# Patient Record
Sex: Female | Born: 1960 | Race: White | Hispanic: No | Marital: Single | Smoking: Current every day smoker
Health system: Southern US, Community
[De-identification: ages and names within clinical notes are randomized; demographics above are authoritative.]

## PROBLEM LIST (undated history)

## (undated) DIAGNOSIS — T07XXXA Unspecified multiple injuries, initial encounter: Secondary | ICD-10-CM

## (undated) DIAGNOSIS — J45909 Unspecified asthma, uncomplicated: Secondary | ICD-10-CM

## (undated) DIAGNOSIS — J40 Bronchitis, not specified as acute or chronic: Secondary | ICD-10-CM

## (undated) DIAGNOSIS — Z720 Tobacco use: Secondary | ICD-10-CM

## (undated) DIAGNOSIS — M2540 Effusion, unspecified joint: Secondary | ICD-10-CM

## (undated) DIAGNOSIS — E785 Hyperlipidemia, unspecified: Secondary | ICD-10-CM

## (undated) DIAGNOSIS — M549 Dorsalgia, unspecified: Secondary | ICD-10-CM

## (undated) DIAGNOSIS — M069 Rheumatoid arthritis, unspecified: Secondary | ICD-10-CM

## (undated) DIAGNOSIS — M542 Cervicalgia: Secondary | ICD-10-CM

## (undated) DIAGNOSIS — F329 Major depressive disorder, single episode, unspecified: Secondary | ICD-10-CM

## (undated) DIAGNOSIS — F32A Depression, unspecified: Secondary | ICD-10-CM

## (undated) DIAGNOSIS — M2550 Pain in unspecified joint: Secondary | ICD-10-CM

## (undated) DIAGNOSIS — K5909 Other constipation: Secondary | ICD-10-CM

## (undated) DIAGNOSIS — Z972 Presence of dental prosthetic device (complete) (partial): Secondary | ICD-10-CM

## (undated) DIAGNOSIS — B958 Unspecified staphylococcus as the cause of diseases classified elsewhere: Secondary | ICD-10-CM

## (undated) DIAGNOSIS — J189 Pneumonia, unspecified organism: Secondary | ICD-10-CM

## (undated) DIAGNOSIS — R238 Other skin changes: Secondary | ICD-10-CM

## (undated) DIAGNOSIS — R233 Spontaneous ecchymoses: Secondary | ICD-10-CM

## (undated) DIAGNOSIS — G8929 Other chronic pain: Secondary | ICD-10-CM

## (undated) DIAGNOSIS — I10 Essential (primary) hypertension: Secondary | ICD-10-CM

## (undated) DIAGNOSIS — F419 Anxiety disorder, unspecified: Secondary | ICD-10-CM

## (undated) DIAGNOSIS — Z973 Presence of spectacles and contact lenses: Secondary | ICD-10-CM

## (undated) HISTORY — PX: ESOPHAGOGASTRECTOMY: SHX1528

## (undated) HISTORY — PX: FOOT SURGERY: SHX648

## (undated) HISTORY — DX: Major depressive disorder, single episode, unspecified: F32.9

## (undated) HISTORY — PX: TONSILLECTOMY: SUR1361

## (undated) HISTORY — DX: Depression, unspecified: F32.A

## (undated) HISTORY — PX: DIAGNOSTIC LAPAROSCOPY: SUR761

## (undated) HISTORY — PX: APPENDECTOMY: SHX54

## (undated) HISTORY — PX: COLONOSCOPY: SHX174

---
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
## 1982-12-17 HISTORY — PX: ABDOMINAL HYSTERECTOMY: SHX81

## 2001-01-22 ENCOUNTER — Other Ambulatory Visit: Admission: RE | Admit: 2001-01-22 | Discharge: 2001-01-22 | Payer: Self-pay | Admitting: Obstetrics & Gynecology

## 2003-04-02 ENCOUNTER — Other Ambulatory Visit: Admission: RE | Admit: 2003-04-02 | Discharge: 2003-04-02 | Payer: Self-pay | Admitting: Obstetrics & Gynecology

## 2004-04-07 ENCOUNTER — Other Ambulatory Visit: Admission: RE | Admit: 2004-04-07 | Discharge: 2004-04-07 | Payer: Self-pay | Admitting: Obstetrics & Gynecology

## 2005-05-24 ENCOUNTER — Other Ambulatory Visit: Admission: RE | Admit: 2005-05-24 | Discharge: 2005-05-24 | Payer: Self-pay | Admitting: Obstetrics & Gynecology

## 2005-10-17 ENCOUNTER — Ambulatory Visit (HOSPITAL_BASED_OUTPATIENT_CLINIC_OR_DEPARTMENT_OTHER): Admission: RE | Admit: 2005-10-17 | Discharge: 2005-10-17 | Payer: Self-pay | Admitting: *Deleted

## 2005-10-17 ENCOUNTER — Ambulatory Visit (HOSPITAL_COMMUNITY): Admission: RE | Admit: 2005-10-17 | Discharge: 2005-10-17 | Payer: Self-pay | Admitting: *Deleted

## 2008-12-17 HISTORY — PX: FOOT SURGERY: SHX648

## 2012-03-17 HISTORY — PX: BACK SURGERY: SHX140

## 2012-03-20 ENCOUNTER — Ambulatory Visit
Admission: RE | Admit: 2012-03-20 | Discharge: 2012-03-20 | Disposition: A | Discharge status: Home / Self Care | Payer: 59 | Source: Ambulatory Visit | Attending: Anesthesiology | Admitting: Anesthesiology

## 2012-03-20 ENCOUNTER — Other Ambulatory Visit: Payer: Self-pay | Admitting: Anesthesiology

## 2012-03-20 DIAGNOSIS — M542 Cervicalgia: Secondary | ICD-10-CM

## 2012-03-20 DIAGNOSIS — M5416 Radiculopathy, lumbar region: Secondary | ICD-10-CM

## 2012-03-24 ENCOUNTER — Encounter (HOSPITAL_COMMUNITY): Payer: Self-pay | Admitting: Pharmacy Technician

## 2012-03-24 ENCOUNTER — Other Ambulatory Visit: Payer: Self-pay | Admitting: Neurosurgery

## 2012-04-02 ENCOUNTER — Encounter (HOSPITAL_COMMUNITY)
Admission: RE | Admit: 2012-04-02 | Discharge: 2012-04-02 | Disposition: A | Discharge status: Home / Self Care | Payer: 59 | Source: Ambulatory Visit | Attending: Anesthesiology | Admitting: Anesthesiology

## 2012-04-02 ENCOUNTER — Encounter (HOSPITAL_COMMUNITY): Payer: Self-pay

## 2012-04-02 ENCOUNTER — Encounter (HOSPITAL_COMMUNITY)
Admission: RE | Admit: 2012-04-02 | Discharge: 2012-04-02 | Disposition: A | Discharge status: Home / Self Care | Payer: 59 | Source: Ambulatory Visit | Attending: Neurosurgery | Admitting: Neurosurgery

## 2012-04-02 HISTORY — DX: Dorsalgia, unspecified: M54.9

## 2012-04-02 HISTORY — DX: Pneumonia, unspecified organism: J18.9

## 2012-04-02 HISTORY — DX: Essential (primary) hypertension: I10

## 2012-04-02 HISTORY — DX: Hyperlipidemia, unspecified: E78.5

## 2012-04-02 HISTORY — DX: Effusion, unspecified joint: M25.40

## 2012-04-02 HISTORY — DX: Bronchitis, not specified as acute or chronic: J40

## 2012-04-02 HISTORY — DX: Pain in unspecified joint: M25.50

## 2012-04-02 HISTORY — DX: Spontaneous ecchymoses: R23.3

## 2012-04-02 HISTORY — DX: Rheumatoid arthritis, unspecified: M06.9

## 2012-04-02 HISTORY — DX: Other constipation: K59.09

## 2012-04-02 HISTORY — DX: Anxiety disorder, unspecified: F41.9

## 2012-04-02 HISTORY — DX: Other skin changes: R23.8

## 2012-04-02 HISTORY — DX: Unspecified staphylococcus as the cause of diseases classified elsewhere: B95.8

## 2012-04-02 HISTORY — DX: Other chronic pain: G89.29

## 2012-04-02 HISTORY — DX: Cervicalgia: M54.2

## 2012-04-02 HISTORY — DX: Unspecified multiple injuries, initial encounter: T07.XXXA

## 2012-04-02 LAB — BASIC METABOLIC PANEL
BUN: 6 mg/dL (ref 6–23)
GFR calc non Af Amer: >90 mL/min (ref >90)
Glucose, Bld: 90 mg/dL (ref 70–99)
Potassium: 4.3 mEq/L (ref 3.5–5.1)

## 2012-04-02 LAB — ABO/RH: ABO/RH(D): A NEG

## 2012-04-02 LAB — URINALYSIS, ROUTINE W REFLEX MICROSCOPIC
Bilirubin Urine: NEGATIVE
Hgb urine dipstick: NEGATIVE
Ketones, ur: NEGATIVE mg/dL
Protein, ur: NEGATIVE mg/dL
Urobilinogen, UA: 0.2 mg/dL (ref 0.0–1.0)

## 2012-04-02 LAB — CBC
HCT: 44 % (ref 36.0–46.0)
Hemoglobin: 14 g/dL (ref 12.0–15.0)
MCH: 31.6 pg (ref 26.0–34.0)
MCHC: 31.8 g/dL (ref 30.0–36.0)

## 2012-04-02 LAB — DIFFERENTIAL
Basophils Relative: 0 % (ref 0–1)
Eosinophils Absolute: 0 10*3/uL (ref 0.0–0.7)
Eosinophils Relative: 0 % (ref 0–5)
Monocytes Absolute: 0.7 10*3/uL (ref 0.1–1.0)
Monocytes Relative: 4 % (ref 3–12)
Neutrophils Relative %: 76 % (ref 43–77)

## 2012-04-02 LAB — TYPE AND SCREEN

## 2012-04-02 MED ORDER — CEFAZOLIN SODIUM 1-5 GM-% IV SOLN: 1.0000 g | INTRAVENOUS | Status: DC

## 2012-04-02 NOTE — Pre-Procedure Instructions (Signed)
20 [name]  04/02/2012   Your procedure is scheduled on:  Tues, April 23 @ 0730  Report to [facility] at 0530 AM.  Call this number if you have problems the morning of surgery: [phone]   Remember:   Do not eat food:After Midnight.  May have clear liquids: up to 4 Hours before arrival.(until 1:30 am )  Clear liquids include soda, tea, black coffee, apple or grape juice, broth,water  Take these medicines the morning of surgery with A SIP OF WATER: Celexa,Gabapentin,and Pain Pill(if needed)   Do not wear jewelry, make-up or nail polish.  Do not wear lotions, powders, or perfumes.   Do not shave 48 hours prior to surgery.  Do not bring valuables to the hospital.  Contacts, dentures or bridgework may not be worn into surgery.  Leave suitcase in the car. After surgery it may be brought to your room.  For patients admitted to the hospital, checkout time is 11:00 AM the day of discharge.   Special Instructions: CHG Shower Use Special Wash: 1/2 bottle night before surgery and 1/2 bottle morning of surgery.   Please read over the following fact sheets that you were given: Pain Booklet, Coughing and Deep Breathing, Blood Transfusion Information, MRSA Information and Surgical Site Infection Prevention

## 2012-04-02 NOTE — Progress Notes (Signed)
Lindwood Coke with Dr.Hirsch to notify of pt allergy to PCN and to see if he wants to continue with Ancef or change to something else

## 2012-04-02 NOTE — Progress Notes (Signed)
Pt doesn't have a cardiogist  Echo/Stress test done in 2005-pt states this was done because blood pressure was high and then was placed on B/P meds  Denies ever having a heart cath  Medical Md is Dr.Heather Sprey at [facility] on [address] [address]

## 2012-04-03 NOTE — Consult Note (Addendum)
Anesthesia Chart Review:  Patient is a 51 year old female scheduled for a L5-S1 laminectomy, PLIF on 04/08/12.  History includes smoking, HTN, HLD, RA, PNA '92, bronchitis '06, anxiety, HLD,   PCP is Dr. Starling Manns at [facility].    Labs noted.  Her WBC is 18.7.  Her neutrophil # on differential was elevated at 14.2.  PLT 467.  UA was negative for leukocytes and nitrites.  She was afebrile at her PAT appointment.  Medications include prednisone, which may be contributing.  CXR on 04/02/12 showed no active disease. Mild thoracic dextroscoliosis.   EKG from 04/02/12 showed NSR.  Have left a voicemail with Shanda Bumps at Dr. Charlynn Court office re: patient's leukocytosis.  If no s/s of acute infection on the day of surgery then okay to proceed from an Anesthesia standpoint.  Defer additional orders, if any, to Dr. Blanche East.  Shonna Chock, PA-C  Addendum: 04/04/12 0945  Dr. Blanche East would like a CBC repeated on the day of surgery.  CBC ordered.

## 2012-04-07 MED ORDER — VANCOMYCIN HCL IN DEXTROSE 1-5 GM/200ML-% IV SOLN
1000.0000 mg | Freq: Once | INTRAVENOUS | Status: AC
  Administered 2012-04-08: 1000 mg via INTRAVENOUS
  Filled 2012-04-07: qty 200

## 2012-04-08 ENCOUNTER — Inpatient Hospital Stay (HOSPITAL_COMMUNITY)
Admission: RE | Admit: 2012-04-08 | Discharge: 2012-04-10 | DRG: 460 | Disposition: A | Discharge status: Home / Self Care | Payer: 59 | Source: Ambulatory Visit | Attending: Neurosurgery | Admitting: Neurosurgery

## 2012-04-08 ENCOUNTER — Encounter (HOSPITAL_COMMUNITY): Payer: Self-pay | Admitting: *Deleted

## 2012-04-08 ENCOUNTER — Encounter (HOSPITAL_COMMUNITY): Payer: Self-pay | Admitting: Vascular Surgery

## 2012-04-08 ENCOUNTER — Ambulatory Visit (HOSPITAL_COMMUNITY): Payer: 59

## 2012-04-08 ENCOUNTER — Encounter (HOSPITAL_COMMUNITY)
Admission: RE | Disposition: A | Discharge status: Home / Self Care | Payer: Self-pay | Source: Ambulatory Visit | Attending: Neurosurgery

## 2012-04-08 ENCOUNTER — Ambulatory Visit (HOSPITAL_COMMUNITY): Payer: 59 | Admitting: Vascular Surgery

## 2012-04-08 DIAGNOSIS — Z79899 Other long term (current) drug therapy: Secondary | ICD-10-CM

## 2012-04-08 DIAGNOSIS — Z01818 Encounter for other preprocedural examination: Secondary | ICD-10-CM

## 2012-04-08 DIAGNOSIS — Z01810 Encounter for preprocedural cardiovascular examination: Secondary | ICD-10-CM

## 2012-04-08 DIAGNOSIS — IMO0002 Reserved for concepts with insufficient information to code with codable children: Secondary | ICD-10-CM

## 2012-04-08 DIAGNOSIS — M5106 Intervertebral disc disorders with myelopathy, lumbar region: Principal | ICD-10-CM | POA: Diagnosis present

## 2012-04-08 DIAGNOSIS — Z6826 Body mass index (BMI) 26.0-26.9, adult: Secondary | ICD-10-CM

## 2012-04-08 DIAGNOSIS — M4716 Other spondylosis with myelopathy, lumbar region: Secondary | ICD-10-CM | POA: Diagnosis present

## 2012-04-08 DIAGNOSIS — Z01812 Encounter for preprocedural laboratory examination: Secondary | ICD-10-CM

## 2012-04-08 LAB — CBC
Hemoglobin: 14 g/dL (ref 12.0–15.0)
Platelets: 410 10*3/uL — ABNORMAL HIGH (ref 150–400)
RBC: 4.4 MIL/uL (ref 3.87–5.11)

## 2012-04-08 SURGERY — POSTERIOR LUMBAR FUSION 1 LEVEL
Anesthesia: General | Site: Spine Lumbar | Wound class: Clean

## 2012-04-08 MED ORDER — METHOCARBAMOL 100 MG/ML IJ SOLN
500.0000 mg | Freq: Four times a day (QID) | INTRAVENOUS | Status: DC | PRN
  Filled 2012-04-08: qty 5

## 2012-04-08 MED ORDER — DIPHENHYDRAMINE HCL 50 MG/ML IJ SOLN: 12.5000 mg | Freq: Four times a day (QID) | INTRAMUSCULAR | Status: DC | PRN

## 2012-04-08 MED ORDER — SODIUM CHLORIDE 0.9 % IV SOLN
INTRAVENOUS | Status: AC
  Filled 2012-04-08: qty 500

## 2012-04-08 MED ORDER — METHOCARBAMOL 500 MG PO TABS
500.0000 mg | ORAL_TABLET | Freq: Four times a day (QID) | ORAL | Status: DC | PRN
  Administered 2012-04-08 – 2012-04-09 (×2): 500 mg via ORAL
  Filled 2012-04-08 (×2): qty 1

## 2012-04-08 MED ORDER — FENTANYL CITRATE 0.05 MG/ML IJ SOLN
INTRAMUSCULAR | Status: DC | PRN
  Administered 2012-04-08: 25 ug via INTRAVENOUS
  Administered 2012-04-08 (×2): 50 ug via INTRAVENOUS
  Administered 2012-04-08: 25 ug via INTRAVENOUS
  Administered 2012-04-08 (×4): 50 ug via INTRAVENOUS
  Administered 2012-04-08: 100 ug via INTRAVENOUS

## 2012-04-08 MED ORDER — SODIUM CHLORIDE 0.9 % IV SOLN
INTRAVENOUS | Status: DC | PRN
  Administered 2012-04-08: 07:00:00 via INTRAVENOUS

## 2012-04-08 MED ORDER — LACTATED RINGERS IV SOLN
INTRAVENOUS | Status: DC | PRN
  Administered 2012-04-08 (×2): via INTRAVENOUS

## 2012-04-08 MED ORDER — DIPHENHYDRAMINE HCL 12.5 MG/5ML PO ELIX: 12.5000 mg | ORAL_SOLUTION | Freq: Four times a day (QID) | ORAL | Status: DC | PRN

## 2012-04-08 MED ORDER — SODIUM CHLORIDE 0.9 % IJ SOLN: 3.0000 mL | INTRAMUSCULAR | Status: DC | PRN

## 2012-04-08 MED ORDER — LOSARTAN POTASSIUM 50 MG PO TABS
50.0000 mg | ORAL_TABLET | Freq: Every day | ORAL | Status: DC
  Administered 2012-04-08 – 2012-04-10 (×3): 50 mg via ORAL
  Filled 2012-04-08 (×3): qty 1

## 2012-04-08 MED ORDER — CITALOPRAM HYDROBROMIDE 10 MG PO TABS
10.0000 mg | ORAL_TABLET | Freq: Every day | ORAL | Status: DC
  Administered 2012-04-09 – 2012-04-10 (×2): 10 mg via ORAL
  Filled 2012-04-08 (×3): qty 1

## 2012-04-08 MED ORDER — PHENYLEPHRINE HCL 10 MG/ML IJ SOLN
INTRAMUSCULAR | Status: DC | PRN
  Administered 2012-04-08: 80 ug via INTRAVENOUS
  Administered 2012-04-08: 40 ug via INTRAVENOUS

## 2012-04-08 MED ORDER — ONDANSETRON HCL 4 MG/2ML IJ SOLN: 4.0000 mg | INTRAMUSCULAR | Status: DC | PRN

## 2012-04-08 MED ORDER — BACITRACIN 50000 UNITS IM SOLR
INTRAMUSCULAR | Status: AC
  Filled 2012-04-08: qty 1

## 2012-04-08 MED ORDER — BISACODYL 10 MG RE SUPP: 10.0000 mg | Freq: Every day | RECTAL | Status: DC | PRN

## 2012-04-08 MED ORDER — NEOSTIGMINE METHYLSULFATE 1 MG/ML IJ SOLN
INTRAMUSCULAR | Status: DC | PRN
  Administered 2012-04-08: 4 mg via INTRAVENOUS

## 2012-04-08 MED ORDER — VANCOMYCIN HCL IN DEXTROSE 1-5 GM/200ML-% IV SOLN
1000.0000 mg | Freq: Two times a day (BID) | INTRAVENOUS | Status: AC
  Administered 2012-04-08 – 2012-04-09 (×3): 1000 mg via INTRAVENOUS
  Filled 2012-04-08 (×3): qty 200

## 2012-04-08 MED ORDER — ACETAMINOPHEN 325 MG PO TABS: 650.0000 mg | ORAL_TABLET | ORAL | Status: DC | PRN

## 2012-04-08 MED ORDER — PROPOFOL 10 MG/ML IV EMUL
INTRAVENOUS | Status: DC | PRN
  Administered 2012-04-08: 200 mg via INTRAVENOUS

## 2012-04-08 MED ORDER — HYDROMORPHONE HCL PF 1 MG/ML IJ SOLN
INTRAMUSCULAR | Status: AC
  Filled 2012-04-08: qty 1

## 2012-04-08 MED ORDER — THROMBIN 20000 UNITS EX KIT
PACK | CUTANEOUS | Status: DC | PRN
  Administered 2012-04-08: 09:00:00 via TOPICAL

## 2012-04-08 MED ORDER — SODIUM CHLORIDE 0.9 % IR SOLN
Status: DC | PRN
  Administered 2012-04-08: 09:00:00

## 2012-04-08 MED ORDER — ZOLPIDEM TARTRATE 10 MG PO TABS: 10.0000 mg | ORAL_TABLET | Freq: Every evening | ORAL | Status: DC | PRN

## 2012-04-08 MED ORDER — ONDANSETRON HCL 4 MG/2ML IJ SOLN
INTRAMUSCULAR | Status: DC | PRN
  Administered 2012-04-08: 4 mg via INTRAVENOUS

## 2012-04-08 MED ORDER — LIDOCAINE-EPINEPHRINE 1 %-1:100000 IJ SOLN
INTRAMUSCULAR | Status: DC | PRN
  Administered 2012-04-08: 20 mL

## 2012-04-08 MED ORDER — ACETAMINOPHEN 650 MG RE SUPP: 650.0000 mg | RECTAL | Status: DC | PRN

## 2012-04-08 MED ORDER — GLYCOPYRROLATE 0.2 MG/ML IJ SOLN
INTRAMUSCULAR | Status: DC | PRN
  Administered 2012-04-08: .8 mg via INTRAVENOUS

## 2012-04-08 MED ORDER — HYDROMORPHONE HCL PF 1 MG/ML IJ SOLN
0.2500 mg | INTRAMUSCULAR | Status: DC | PRN
  Administered 2012-04-08: 0.5 mg via INTRAVENOUS

## 2012-04-08 MED ORDER — 0.9 % SODIUM CHLORIDE (POUR BTL) OPTIME
TOPICAL | Status: DC | PRN
  Administered 2012-04-08: 1000 mL

## 2012-04-08 MED ORDER — DOCUSATE SODIUM 100 MG PO CAPS
100.0000 mg | ORAL_CAPSULE | Freq: Two times a day (BID) | ORAL | Status: DC
  Administered 2012-04-08 – 2012-04-10 (×4): 100 mg via ORAL
  Filled 2012-04-08 (×4): qty 1

## 2012-04-08 MED ORDER — PREDNISONE 10 MG PO TABS
10.0000 mg | ORAL_TABLET | Freq: Every day | ORAL | Status: DC
  Administered 2012-04-08 – 2012-04-10 (×3): 10 mg via ORAL
  Filled 2012-04-08 (×3): qty 1

## 2012-04-08 MED ORDER — SODIUM CHLORIDE 0.9 % IJ SOLN: 9.0000 mL | INTRAMUSCULAR | Status: DC | PRN

## 2012-04-08 MED ORDER — EPHEDRINE SULFATE 50 MG/ML IJ SOLN
INTRAMUSCULAR | Status: DC | PRN
  Administered 2012-04-08 (×2): 5 mg via INTRAVENOUS

## 2012-04-08 MED ORDER — KCL IN DEXTROSE-NACL 20-5-0.45 MEQ/L-%-% IV SOLN
INTRAVENOUS | Status: DC
  Administered 2012-04-08: 18:00:00 via INTRAVENOUS
  Filled 2012-04-08 (×5): qty 1000

## 2012-04-08 MED ORDER — FLEET ENEMA 7-19 GM/118ML RE ENEM: 1.0000 | ENEMA | Freq: Once | RECTAL | Status: AC | PRN

## 2012-04-08 MED ORDER — HETASTARCH-ELECTROLYTES 6 % IV SOLN
INTRAVENOUS | Status: DC | PRN
  Administered 2012-04-08: 09:00:00 via INTRAVENOUS

## 2012-04-08 MED ORDER — GABAPENTIN 300 MG PO CAPS
300.0000 mg | ORAL_CAPSULE | Freq: Three times a day (TID) | ORAL | Status: DC
  Administered 2012-04-08 – 2012-04-10 (×5): 300 mg via ORAL
  Filled 2012-04-08 (×8): qty 1

## 2012-04-08 MED ORDER — MORPHINE SULFATE (PF) 1 MG/ML IV SOLN
INTRAVENOUS | Status: DC
  Administered 2012-04-08: 1.5 mg via INTRAVENOUS

## 2012-04-08 MED ORDER — ESTRADIOL 2 MG PO TABS
2.0000 mg | ORAL_TABLET | Freq: Every day | ORAL | Status: DC
  Administered 2012-04-08 – 2012-04-10 (×3): 2 mg via ORAL
  Filled 2012-04-08 (×3): qty 1

## 2012-04-08 MED ORDER — SODIUM CHLORIDE 0.9 % IJ SOLN
3.0000 mL | Freq: Two times a day (BID) | INTRAMUSCULAR | Status: DC
  Administered 2012-04-08 – 2012-04-09 (×3): 3 mL via INTRAVENOUS

## 2012-04-08 MED ORDER — NALOXONE HCL 0.4 MG/ML IJ SOLN: 0.4000 mg | INTRAMUSCULAR | Status: DC | PRN

## 2012-04-08 MED ORDER — ONDANSETRON HCL 4 MG/2ML IJ SOLN: 4.0000 mg | Freq: Four times a day (QID) | INTRAMUSCULAR | Status: DC | PRN

## 2012-04-08 MED ORDER — ROCURONIUM BROMIDE 100 MG/10ML IV SOLN
INTRAVENOUS | Status: DC | PRN
  Administered 2012-04-08 (×3): 5 mg via INTRAVENOUS
  Administered 2012-04-08: 50 mg via INTRAVENOUS

## 2012-04-08 MED ORDER — MAGNESIUM HYDROXIDE 400 MG/5ML PO SUSP: 30.0000 mL | Freq: Every day | ORAL | Status: DC | PRN

## 2012-04-08 MED ORDER — LIDOCAINE HCL (CARDIAC) 20 MG/ML IV SOLN
INTRAVENOUS | Status: DC | PRN
  Administered 2012-04-08: 50 mg via INTRAVENOUS

## 2012-04-08 MED ORDER — MORPHINE SULFATE (PF) 1 MG/ML IV SOLN
INTRAVENOUS | Status: DC
  Administered 2012-04-08: via INTRAVENOUS
  Administered 2012-04-08: 9 mg via INTRAVENOUS
  Administered 2012-04-08 – 2012-04-09 (×2): 6 mg via INTRAVENOUS
  Administered 2012-04-09: 2 mg via INTRAVENOUS
  Filled 2012-04-08: qty 25

## 2012-04-08 MED ORDER — PHENYLEPHRINE HCL 10 MG/ML IJ SOLN
10.0000 mg | INTRAVENOUS | Status: DC | PRN
  Administered 2012-04-08: 10 ug/min via INTRAVENOUS

## 2012-04-08 MED ORDER — MORPHINE SULFATE (PF) 1 MG/ML IV SOLN
INTRAVENOUS | Status: AC
  Filled 2012-04-08: qty 25

## 2012-04-08 MED ORDER — KETOROLAC TROMETHAMINE 30 MG/ML IJ SOLN
30.0000 mg | Freq: Four times a day (QID) | INTRAMUSCULAR | Status: AC
  Administered 2012-04-08 – 2012-04-09 (×4): 30 mg via INTRAVENOUS
  Filled 2012-04-08 (×4): qty 1

## 2012-04-08 MED ORDER — SODIUM CHLORIDE 0.9 % IV SOLN: 250.0000 mL | INTRAVENOUS | Status: DC

## 2012-04-08 MED ORDER — DEXAMETHASONE SODIUM PHOSPHATE 10 MG/ML IJ SOLN
INTRAMUSCULAR | Status: DC | PRN
  Administered 2012-04-08: 10 mg via INTRAVENOUS

## 2012-04-08 MED ORDER — ONDANSETRON HCL 4 MG/2ML IJ SOLN: 4.0000 mg | Freq: Once | INTRAMUSCULAR | Status: DC | PRN

## 2012-04-08 SURGICAL SUPPLY — 71 items
ADH SKN CLS APL DERMABOND .7 (GAUZE/BANDAGES/DRESSINGS)
ADH SKN CLS LQ APL DERMABOND (GAUZE/BANDAGES/DRESSINGS) ×1
APL SKNCLS STERI-STRIP NONHPOA (GAUZE/BANDAGES/DRESSINGS) ×1
BAG DECANTER FOR FLEXI CONT (MISCELLANEOUS) ×2 IMPLANT
BENZOIN TINCTURE PRP APPL 2/3 (GAUZE/BANDAGES/DRESSINGS) ×2 IMPLANT
BLADE SURG ROTATE 9660 (MISCELLANEOUS) IMPLANT
BUR PRECISION FLUTE 5.0 (BURR) ×2 IMPLANT
CAGE CONCORDE BULLET 9X7X23 (Cage) ×2 IMPLANT
CANISTER SUCTION 2500CC (MISCELLANEOUS) ×2 IMPLANT
CLOTH BEACON ORANGE TIMEOUT ST (SAFETY) ×2 IMPLANT
CONT SPEC 4OZ CLIKSEAL STRL BL (MISCELLANEOUS) ×4 IMPLANT
COVER BACK TABLE 24X17X13 BIG (DRAPES) ×1 IMPLANT
COVER TABLE BACK 60X90 (DRAPES) ×1 IMPLANT
DECANTER SPIKE VIAL GLASS SM (MISCELLANEOUS) ×2 IMPLANT
DERMABOND ADHESIVE PROPEN (GAUZE/BANDAGES/DRESSINGS) ×1
DERMABOND ADVANCED (GAUZE/BANDAGES/DRESSINGS)
DERMABOND ADVANCED .7 DNX12 (GAUZE/BANDAGES/DRESSINGS) ×1 IMPLANT
DERMABOND ADVANCED .7 DNX6 (GAUZE/BANDAGES/DRESSINGS) IMPLANT
DRAPE C-ARM 42X72 X-RAY (DRAPES) ×4 IMPLANT
DRAPE LAPAROTOMY 100X72X124 (DRAPES) ×2 IMPLANT
DRAPE POUCH INSTRU U-SHP 10X18 (DRAPES) ×2 IMPLANT
DRAPE PROXIMA HALF (DRAPES) IMPLANT
DRESSING TELFA 8X3 (GAUZE/BANDAGES/DRESSINGS) ×1 IMPLANT
DURAPREP 26ML APPLICATOR (WOUND CARE) ×2 IMPLANT
ELECT REM PT RETURN 9FT ADLT (ELECTROSURGICAL) ×2
ELECTRODE REM PT RTRN 9FT ADLT (ELECTROSURGICAL) ×1 IMPLANT
GAUZE SPONGE 4X4 16PLY XRAY LF (GAUZE/BANDAGES/DRESSINGS) IMPLANT
GLOVE BIOGEL PI IND STRL 7.0 (GLOVE) IMPLANT
GLOVE BIOGEL PI IND STRL 7.5 (GLOVE) IMPLANT
GLOVE BIOGEL PI INDICATOR 7.0 (GLOVE) ×1
GLOVE BIOGEL PI INDICATOR 7.5 (GLOVE) ×1
GLOVE ECLIPSE 7.5 STRL STRAW (GLOVE) ×5 IMPLANT
GLOVE EXAM NITRILE LRG STRL (GLOVE) IMPLANT
GLOVE EXAM NITRILE MD LF STRL (GLOVE) ×1 IMPLANT
GLOVE EXAM NITRILE XL STR (GLOVE) IMPLANT
GLOVE EXAM NITRILE XS STR PU (GLOVE) IMPLANT
GLOVE SURG SS PI 6.5 STRL IVOR (GLOVE) ×2 IMPLANT
GOWN BRE IMP SLV AUR LG STRL (GOWN DISPOSABLE) ×2 IMPLANT
GOWN BRE IMP SLV AUR XL STRL (GOWN DISPOSABLE) ×1 IMPLANT
GOWN STRL REIN 2XL LVL4 (GOWN DISPOSABLE) ×3 IMPLANT
HEALOS II DOUBLE STRIP 10CC (Peek) ×1 IMPLANT
KIT BASIN OR (CUSTOM PROCEDURE TRAY) ×2 IMPLANT
KIT INFUSE X SMALL 1.4CC (Orthopedic Implant) ×1 IMPLANT
KIT ROOM TURNOVER OR (KITS) ×2 IMPLANT
MILL MEDIUM DISP (BLADE) ×1 IMPLANT
NEEDLE HYPO 22GX1.5 SAFETY (NEEDLE) ×2 IMPLANT
NS IRRIG 1000ML POUR BTL (IV SOLUTION) ×2 IMPLANT
PACK LAMINECTOMY NEURO (CUSTOM PROCEDURE TRAY) ×2 IMPLANT
PAD ARMBOARD 7.5X6 YLW CONV (MISCELLANEOUS) ×8 IMPLANT
PATTIES SURGICAL .75X.75 (GAUZE/BANDAGES/DRESSINGS) ×2 IMPLANT
ROD EXPEDIUM PREBENT 5.5X30MM (Rod) ×2 IMPLANT
SCREW EXPEDIUM POLYAXIAL 6X45M (Screw) ×2 IMPLANT
SCREW EXPEDIUM POLYAXIAL 6X50M (Screw) ×2 IMPLANT
SCREW SET SINGLE INNER (Screw) ×4 IMPLANT
SPONGE GAUZE 4X4 12PLY (GAUZE/BANDAGES/DRESSINGS) ×2 IMPLANT
SPONGE LAP 4X18 X RAY DECT (DISPOSABLE) IMPLANT
SPONGE SURGIFOAM ABS GEL 100 (HEMOSTASIS) ×2 IMPLANT
STRIP CLOSURE SKIN 1/2X4 (GAUZE/BANDAGES/DRESSINGS) ×2 IMPLANT
SUT VIC AB 0 CT1 18XCR BRD8 (SUTURE) ×21 IMPLANT
SUT VIC AB 0 CT1 8-18 (SUTURE) ×4
SUT VIC AB 2-0 CP2 18 (SUTURE) ×3 IMPLANT
SUT VIC AB 3-0 SH 8-18 (SUTURE) ×4 IMPLANT
SYR 20ML ECCENTRIC (SYRINGE) ×2 IMPLANT
SYR CONTROL 10ML LL (SYRINGE) ×1 IMPLANT
TAPE CLOTH SURG 4X10 WHT LF (GAUZE/BANDAGES/DRESSINGS) ×1 IMPLANT
TOWEL OR 17X24 6PK STRL BLUE (TOWEL DISPOSABLE) ×2 IMPLANT
TOWEL OR 17X26 10 PK STRL BLUE (TOWEL DISPOSABLE) ×2 IMPLANT
TRAP SPECIMEN MUCOUS 40CC (MISCELLANEOUS) IMPLANT
TRAY FOLEY CATH 14FRSI W/METER (CATHETERS) ×2 IMPLANT
TUBE CONNECTING 12X1/4 (SUCTIONS) ×1 IMPLANT
WATER STERILE IRR 1000ML POUR (IV SOLUTION) ×2 IMPLANT

## 2012-04-08 NOTE — Preoperative (Signed)
Beta Blockers   Reason not to administer Beta Blockers:Not Applicable 

## 2012-04-08 NOTE — Interval H&P Note (Signed)
History and Physical Interval Note:  04/08/2012 7:44 AM  Katelyn Cook  has presented today for surgery, with the diagnosis of Lumbar hnp with myelopathy, Lumbar stenosis, Lumbar spondylosis with myelopathy, lumbar radiculopathy, Lumbago  The various methods of treatment have been discussed with the patient and family. After consideration of risks, benefits and other options for treatment, the patient has consented to  Procedure(s) (LRB): POSTERIOR LUMBAR FUSION 1 LEVEL (N/A) as a surgical intervention .  The patients' history has been reviewed, patient examined, no change in status, stable for surgery.  I have reviewed the patients' chart and labs.  Questions were answered to the patient's satisfaction.     Rainn Bullinger R

## 2012-04-08 NOTE — Anesthesia Postprocedure Evaluation (Signed)
  Anesthesia Post-op Note  Patient: Katelyn Cook  Procedure(s) Performed: Procedure(s) (LRB): POSTERIOR LUMBAR FUSION 1 LEVEL (N/A)  Patient Location: PACU  Anesthesia Type: General  Level of Consciousness: awake, alert , oriented and patient cooperative  Airway and Oxygen Therapy: Patient Spontanous Breathing and Patient connected to nasal cannula oxygen  Post-op Pain: mild  Post-op Assessment: Post-op Vital signs reviewed, Patient's Cardiovascular Status Stable, Respiratory Function Stable, Patent Airway and No signs of Nausea or vomiting  Post-op Vital Signs: stable  Complications: No apparent anesthesia complications

## 2012-04-08 NOTE — Op Note (Signed)
04/08/2012  10:58 AM  PATIENT:  Katelyn Cook  51 y.o. female  PRE-OPERATIVE DIAGNOSIS:  Lumbar hnp with myelopathy, Lumbar stenosis, Lumbar spondylosis with myelopathy, lumbar radiculopathy, Lumbago  POST-OPERATIVE DIAGNOSIS:  Lumbar Herniated Nucleous Pulposus with Myelopathy,Lumbar Stenosis,Lumbar spondylosis with Myelopathy,Lumbar Radiculopathy,Lumbago  PROCEDURE:  Procedure(s):decmpressive lam decompressing the L5 and S1 roots, PLIF L5S1, interbody cages L5S1, non segmented expedium screws L5S1, post lateral fusion L5S1, autograft, infuse, helos, bonemarrow aspirate POSTERIOR LUMBAR FUSION 1 LEVEL  SURGEON:  Surgeon(s): Clydene Fake, MD  ASSISTANTS:Botero  ANESTHESIA:   general  EBL:  Total I/O In: 1200 [I.V.:1200] Out: 500 [Urine:400; Blood:100]  BLOOD ADMINISTERED:none  DRAINS: none   SPECIMEN:  No Specimen  DICTATION: Patient with back and leg pain worse on the left more easily significantly increased left leg pain. work up showed a known spondylitic change with increased size in the left this side disc herniation at 5 on patient brought in for decompression and fusion of this level.  (From general anesthesia induced patient placed in a prone position Wilson frame all pressure points padded. Slight incision injected with 20 cc 1% lidocaine. Incision was then made in the midline the lower lumbosacral spine incision taken the fascia hemostasis obtained with Bovie cauterization. The fascia was incised with a Bovie and subperiosteal dissection over the spinous process lamina to the facet. We exposed the transverse processes of L5 the lateral sacrum. Markers placed the pedicle report for L5 and S1 level the marker at the 5 in interspace x-rays obtained confirming or positioning. Checking retractors placed in laminectomy started with Leksell rongeurs and completed with Kerrison punches removing the spinous process versus process and lamina of 5 top of S1 and medial facetectomy was  done. This decompressed the canal we explored the lateral gutters and and extensors from him is over the S1 roots bilaterally we may 2 fiber 12 and decompressed. We explored the epidural space the left side a large disc herniation with a fragment going caudally was found and this was removed. The space was very tight but we were able to carefully into the disc space and sequentially distracted distracted the space up to 7 mm. We prepared the interbody fusion with curettes and scrapers 2 interbody cages with infuse BMP and autograft bone we placed our bone and the displacement 2 cages in this place one on each side. We then used a high-speed drill to decorticate the facets are present lateral sacrum and transverse process L5-S1 area using the fluoroscopy and intraoperative landmarks, pedicle point for L5 and S1 were used to drill to remove the cortical bone and placed a pedicle probe down the pedicle using fluoroscopy at. We aspirated bone marrow from each pedicle placed the subcutaneous the sponges detectable injury to bony circumference a small ball probe placed Expedium pedicle screw is a 50 mm L5 45 mm at S1 bilaterally. Finally the arthroscopy imaging showed good position pedicle screws. We packed autograft bone infusion BMP a healed with bone marrow aspirate and the posterior orders for posterior fusion L5-S1 bilaterally with described in the screw heads placed locking nuts and final tightened those. We explored the dura nerve root with decompression of this was placed with Gelfoam the lateral gutters retractors removed with good hemostasis fascia closed with 0 Vicryl interrupted sutures subcutaneous tissue closed with 020 3-0 Vicryl interrupted sutures skin closed benzoin Steri-Strips dressing was placed back in spine position and transferred to recovery room  PLAN OF CARE: Admit to inpatient   PATIENT DISPOSITION:  PACU - hemodynamically stable.

## 2012-04-08 NOTE — H&P (Signed)
See H& P.

## 2012-04-08 NOTE — Anesthesia Preprocedure Evaluation (Addendum)
Anesthesia Evaluation  Patient identified by MRN, date of birth, ID band Patient awake    Reviewed: Allergy & Precautions, H&P , NPO status , Patient's Chart, lab work & pertinent test results  Airway Mallampati: I TM Distance: >3 FB Neck ROM: full    Dental   Pulmonary pneumonia , Current Smoker,          Cardiovascular hypertension, Pt. on medications Rhythm:regular Rate:Normal     Neuro/Psych Anxiety    GI/Hepatic   Endo/Other    Renal/GU      Musculoskeletal  (+) Arthritis -, Rheumatoid disorders and on steriods ,    Abdominal   Peds  Hematology   Anesthesia Other Findings   Reproductive/Obstetrics                         Anesthesia Physical Anesthesia Plan  ASA: II  Anesthesia Plan: General   Post-op Pain Management:    Induction: Intravenous  Airway Management Planned: Oral ETT  Additional Equipment:   Intra-op Plan:   Post-operative Plan: Extubation in OR  Informed Consent: I have reviewed the patients History and Physical, chart, labs and discussed the procedure including the risks, benefits and alternatives for the proposed anesthesia with the patient or authorized representative who has indicated his/her understanding and acceptance.     Plan Discussed with: CRNA, Anesthesiologist and Surgeon  Anesthesia Plan Comments:         Anesthesia Quick Evaluation

## 2012-04-08 NOTE — Anesthesia Procedure Notes (Addendum)
Date/Time: 04/08/2012 8:22 AM Performed by: Julianne Rice K   Procedure Name: Intubation Date/Time: 04/08/2012 8:01 AM Performed by: Julianne Rice K Pre-anesthesia Checklist: Patient identified, Timeout performed, Emergency Drugs available, Suction available and Patient being monitored Patient Re-evaluated:Patient Re-evaluated prior to inductionOxygen Delivery Method: Circle system utilized Preoxygenation: Pre-oxygenation with 100% oxygen Intubation Type: IV induction Ventilation: Mask ventilation without difficulty Laryngoscope Size: Miller and 2 Grade View: Grade II Tube size: 8.0 mm Number of attempts: 1 Airway Equipment and Method: Stylet and LTA kit utilized Placement Confirmation: ETT inserted through vocal cords under direct vision,  breath sounds checked- equal and bilateral and positive ETCO2 Secured at: 22 cm Tube secured with: Tape Dental Injury: Teeth and Oropharynx as per pre-operative assessment

## 2012-04-08 NOTE — Progress Notes (Signed)
ANTIBIOTIC CONSULT NOTE - INITIAL  Pharmacy Consult for Vancomycin Indication: post surgery prophylaxis  Allergies  Allergen Reactions  . Erythromycin Other (See Comments)    blisters  . Penicillins Other (See Comments)    blisters  . Sulfa Antibiotics Rash    itching    Patient Measurements: Height: 5\' 8"  (172.7 cm) (preadmit 04/02/12) Weight: 176 lb 9.4 oz (80.1 kg) (preadmit 04/02/12) IBW/kg (Calculated) : 63.9    Vital Signs: Temp: 98 F (36.7 C) (04/23 1239) Temp src: Oral (04/23 1239) BP: 116/78 mmHg (04/23 1239) Pulse Rate: 90  (04/23 1239) Intake/Output from previous day:   Intake/Output from this shift: Total I/O In: 2200 [I.V.:2200] Out: 700 [Urine:600; Blood:100]  Labs: Preadmit 4/17  SCr 0.75, BUN 6,  WBC 18.7  Basename 04/08/12 0621  WBC 22.0*  HGB 14.0  PLT 410*  LABCREA --  CREATININE --   Estimated Creatinine Clearance: 92.5 ml/min (by C-G formula based on Cr of 0.75). No results found for this basename: VANCOTROUGH:2,VANCOPEAK:2,VANCORANDOM:2,GENTTROUGH:2,GENTPEAK:2,GENTRANDOM:2,TOBRATROUGH:2,TOBRAPEAK:2,TOBRARND:2,AMIKACINPEAK:2,AMIKACINTROU:2,AMIKACIN:2, in the last 72 hours   Microbiology: Recent Results (from the past 720 hour(s))  SURGICAL PCR SCREEN     Status: Normal   Collection Time   04/02/12  2:18 PM      Component Value Range Status Comment   MRSA, PCR NEGATIVE  NEGATIVE  Final    Staphylococcus aureus NEGATIVE  NEGATIVE  Final     Medical History: Past Medical History  Diagnosis Date  . Hypertension     takes Losartan daily  . Hyperlipidemia     but doesn't take any meds at this time  . Bronchitis     hx of-around 2006  . Neck pain     herniated disc  . Joint pain   . Joint swelling   . Chronic back pain     radiculopathy,lumbago/stenosis/herniated disc  . Bruises easily     pt states d/t being on Prednisone since 2006  . Multiple bruises     on both arms  . Chronic constipation     takes Amitiza prn  .  Hemorrhoids   . Urinary incontinence   . Rheumatoid arthritis     takes Orencia monthly  . Anxiety     takes Citalopram daily  . Pneumonia     hx of in 1992  . Staph infection     hx of in 1997    Medications:  Prescriptions prior to admission  Medication Sig Dispense Refill  . Abatacept (ORENCIA) 125 MG/ML SOLN Inject into the skin. 1 injection each month      . B Complex Vitamins (VITAMIN B-COMPLEX 100) INJ Inject as directed. 1 injection each month      . citalopram (CELEXA) 10 MG tablet Take 10 mg by mouth daily.      Marland Kitchen estradiol (ESTRACE) 2 MG tablet Take 2 mg by mouth daily.      Marland Kitchen gabapentin (NEURONTIN) 300 MG capsule Take 300 mg by mouth 3 (three) times daily.      . hydrocodone-acetaminophen (LORCET-HD) 5-500 MG per capsule Take 1 capsule by mouth every 6 (six) hours as needed. For pain      . losartan (COZAAR) 50 MG tablet Take 50 mg by mouth daily.      . predniSONE (DELTASONE) 5 MG tablet Take 10 mg by mouth daily.      . traMADol (ULTRAM) 50 MG tablet Take 50 mg by mouth every 6 (six) hours as needed. For pain not to exceed 6 tablets in a  24hr period      . meloxicam (MOBIC) 7.5 MG tablet Take 7.5 mg by mouth daily as needed. For pain       Scheduled:    . bacitracin      . citalopram  10 mg Oral Daily  . docusate sodium  100 mg Oral BID  . estradiol  2 mg Oral Daily  . gabapentin  300 mg Oral TID  . HYDROmorphone      . ketorolac  30 mg Intravenous Q6H  . losartan  50 mg Oral Daily  . morphine   Intravenous Q4H  . morphine      . predniSONE  10 mg Oral Daily  . sodium chloride      . sodium chloride  3 mL Intravenous Q12H  . vancomycin  1,000 mg Intravenous Once   Assessment: 51 yo female s/p lumbar fusion to receive 3 doses of Vancomycin post op.  Preadmit labs 4/17, SCr =0.75. Estimated CrCl ~ 105ml/min. Received 1gm IV Vancomycin today preop @ 07:15  Goal of Therapy:  Vancomycin trough level 10-15 mcg/ml  Plan:  Vancomycin 1gm IV q12hr x 3 doses post  op  Arman Filter, RPh 04/08/2012,1:01 PM

## 2012-04-08 NOTE — Transfer of Care (Signed)
Immediate Anesthesia Transfer of Care Note  Patient: Katelyn Cook  Procedure(s) Performed: Procedure(s) (LRB): POSTERIOR LUMBAR FUSION 1 LEVEL (N/A)  Patient Location: PACU  Anesthesia Type: General  Level of Consciousness: awake and alert   Airway & Oxygen Therapy: Patient Spontanous Breathing and Patient connected to nasal cannula oxygen  Post-op Assessment: Report given to PACU RN and Post -op Vital signs reviewed and stable  Post vital signs: Reviewed and stable  Complications: No apparent anesthesia complications

## 2012-04-09 MED ORDER — HYDROCODONE-ACETAMINOPHEN 10-325 MG PO TABS: 1.0000 | ORAL_TABLET | ORAL | Status: DC | PRN

## 2012-04-09 MED ORDER — PROMETHAZINE HCL 25 MG/ML IJ SOLN
12.5000 mg | INTRAMUSCULAR | Status: DC | PRN
  Filled 2012-04-09: qty 1

## 2012-04-09 MED ORDER — MORPHINE SULFATE 2 MG/ML IJ SOLN: 2.0000 mg | INTRAMUSCULAR | Status: DC | PRN

## 2012-04-09 MED ORDER — OXYCODONE-ACETAMINOPHEN 5-325 MG PO TABS
1.0000 | ORAL_TABLET | ORAL | Status: DC | PRN
  Administered 2012-04-09 – 2012-04-10 (×4): 2 via ORAL
  Filled 2012-04-09 (×4): qty 2

## 2012-04-09 MED ORDER — PROMETHAZINE HCL 25 MG PO TABS: 12.5000 mg | ORAL_TABLET | ORAL | Status: DC | PRN

## 2012-04-09 NOTE — Evaluation (Signed)
Occupational Therapy Evaluation Patient Details Name: Katelyn Cook MRN: [id number] DOB: 25-Mar-1961 Today's Date: 04/09/2012 Time: 8657-8469 OT Time Calculation (min): 30 min  OT Assessment / Plan / Recommendation Clinical Impression  PT admitted for L5/S1 lumbar fusion due to back pain and instability who has deficits in areas of balance and basic functional mobility which affects her I with basic adls.  Pt would benefit from cont. OT to increase I with adls to Mod I level since she lives alone.      OT Assessment  Patient needs continued OT Services    Follow Up Recommendations  Home health OT;Supervision - Intermittent (HHOT since pt lives alone.)    Equipment Recommendations  3 in 1 bedside comode    Frequency Min 2X/week    Precautions / Restrictions Precautions Precautions: Back Precaution Comments: Reviewed no bending/twisting and arching.  Pt needed cues to recall them. Required Braces or Orthoses: Spinal Brace Spinal Brace: Lumbar corset;Applied in sitting position Restrictions Weight Bearing Restrictions: No Other Position/Activity Restrictions: Check BP when up.  Pts BP low when up.   Pertinent Vitals/Pain Pt's BP 96/64 in sitting.  O2 sats dropped to 85% w/o O2.  4L of O2 reapplied and pt quickly recovered to 91%.    ADL  Eating/Feeding: Performed;Independent Where Assessed - Eating/Feeding: Edge of bed Grooming: Performed;Wash/dry hands;Teeth care;Independent Where Assessed - Grooming: Supported sitting Upper Body Bathing: Simulated;Set up Where Assessed - Upper Body Bathing: Sitting, chair Lower Body Bathing: Simulated;Minimal assistance Where Assessed - Lower Body Bathing: Sit to stand from chair Upper Body Dressing: Performed;Moderate assistance;Other (comment) (assist needed more for brace than anything.) Where Assessed - Upper Body Dressing: Sitting, chair Lower Body Dressing: Performed;Minimal assistance Where Assessed - Lower Body Dressing: Sit to stand  from chair Toilet Transfer: Performed;Minimal assistance Toilet Transfer Method: Stand pivot Toilet Transfer Equipment: Bedside commode Toileting - Clothing Manipulation: Simulated;Minimal assistance Where Assessed - Glass blower/designer Manipulation: Standing Toileting - Hygiene: Simulated;Supervision/safety Where Assessed - Toileting Hygiene: Sit on 3-in-1 or toilet Tub/Shower Transfer: Not assessed Tub/Shower Transfer Method: Not assessed Ambulation Related to ADLs: Pt took few steps to chair with hand held assist. ADL Comments: Pt does well with adls. pt most limited by being groggy.  Pt needs more assist with LE adls than upper body adls.    OT Goals Acute Rehab OT Goals OT Goal Formulation: With patient Time For Goal Achievement: 04/16/12 Potential to Achieve Goals: Good ADL Goals Pt Will Perform Grooming: with modified independence;Standing at sink ADL Goal: Grooming - Progress: Goal set today Pt Will Perform Lower Body Bathing: with modified independence;Standing at sink;Sitting at sink ADL Goal: Lower Body Bathing - Progress: Goal set today Pt Will Perform Upper Body Dressing: with modified independence;Sit to stand from chair;Other (comment) (including donning brace.) ADL Goal: Upper Body Dressing - Progress: Goal set today Pt Will Perform Lower Body Dressing: with modified independence;Sit to stand from chair ADL Goal: Lower Body Dressing - Progress: Goal set today Pt Will Perform Tub/Shower Transfer: Tub transfer;with modified independence;Maintaining back safety precautions ADL Goal: Tub/Shower Transfer - Progress: Goal set today Additional ADL Goal #1: Pt will complete all toileting on 3:1 over commode with modified I. ADL Goal: Additional Goal #1 - Progress: Goal set today  Visit Information  Last OT Received On: 04/09/12 Assistance Needed: +1    Subjective Data  Subjective: I just feel woozy. Patient Stated Goal: to go home by myself.   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 Communication Communication: No difficulties Dominant Hand: Right    Cognition  Overall Cognitive Status: Appears within functional limits for tasks assessed/performed Arousal/Alertness: Lethargic Orientation Level: Oriented X4 / Intact Behavior During Session: Lethargic Cognition - Other Comments: Intact.  Pt just groggy possibly from meds.    Extremity/Trunk Assessment Right Upper Extremity Assessment RUE ROM/Strength/Tone: Within functional levels RUE Sensation: WFL - Light Touch RUE Coordination: WFL - gross/fine motor Left Upper Extremity Assessment LUE ROM/Strength/Tone: Within functional levels LUE Sensation: WFL - Light Touch LUE Coordination: WFL - gross/fine motor   Mobility Bed Mobility Bed Mobility: Rolling Left;Left Sidelying to Sit;Sitting - Scoot to Edge of Bed Rolling Left: 5: Supervision;With rail Left Sidelying to Sit: 4: Min guard;With rails;HOB flat Sitting - Scoot to Edge of Bed: 7: Independent Transfers Transfers: Sit to Stand;Stand to Sit Sit to Stand: 4: Min guard;With upper extremity assist;From bed Stand to Sit: 4: Min guard;To chair/3-in-1;With armrests;With upper extremity assist Details for Transfer Assistance: min guard provided since this was first time up and pt's BP running low.   Exercise    Balance Balance Balance Assessed: No  End of Session OT - End of Session Equipment Utilized During Treatment: Back brace Activity Tolerance: Patient tolerated treatment  well Patient left: in chair;with call bell/phone within reach Nurse Communication: Mobility status   Hope Budds 04/09/2012, 9:41 AM [phone]

## 2012-04-09 NOTE — Evaluation (Signed)
Physical Therapy Evaluation Patient Details Name: Katelyn Cook MRN: [id number] DOB: 31-Jan-1961 Today's Date: 04/09/2012 Time: 9147-8295 PT Time Calculation (min): 30 min  PT Assessment / Plan / Recommendation Clinical Impression  pt is s/p L5/S1 PLIF.  Mobility is hindered by pain, mild weakness and decreased activity tolerance.  All education addressed, but needs to be reinforced.  Recommend [facility].    PT Assessment  Patient needs continued PT services    Follow Up Recommendations  Home health PT    Equipment Recommendations  3 in 1 bedside comode    Frequency Min 6X/week    Precautions / Restrictions Precautions Precautions: Back Precaution Comments: Reviewed no bending/twisting and arching.  Pt needed cues to recall them. Required Braces or Orthoses: Spinal Brace Spinal Brace: Lumbar corset;Applied in sitting position Restrictions Weight Bearing Restrictions: No Other Position/Activity Restrictions: Check BP when up.  Pts BP low when up.   Pertinent Vitals/Pain       Mobility  Bed Mobility Bed Mobility: Rolling Left;Left Sidelying to Sit;Sitting - Scoot to Edge of Bed Rolling Left: 5: Supervision;With rail Left Sidelying to Sit: With rails;HOB flat;4: Min assist Sitting - Scoot to Edge of Bed: 7: Independent Details for Bed Mobility Assistance: vc's for technique and safety; minimal assist to sit from sidelying Transfers Transfers: Sit to Stand;Stand to Sit Sit to Stand: 4: Min guard;With upper extremity assist;From bed Stand to Sit: 4: Min guard;To chair/3-in-1;With armrests;With upper extremity assist Details for Transfer Assistance: min guard for safety only Ambulation/Gait Ambulation/Gait Assistance: 4: Min assist;4: Min guard (min guard as she progressed) Ambulation Distance (Feet): 140 Feet Assistive device: 1 person hand held assist;None Gait Pattern: Within Functional Limits (but stiff and guarded) Stairs: No Wheelchair Mobility Wheelchair Mobility: No      Exercises     PT Goals Acute Rehab PT Goals PT Goal Formulation: With patient Time For Goal Achievement: 04/15/12 Potential to Achieve Goals: Good Pt will go Supine/Side to Sit: with modified independence;with HOB 0 degrees PT Goal: Supine/Side to Sit - Progress: Goal set today Pt will go Sit to Stand: with modified independence PT Goal: Sit to Stand - Progress: Goal set today Pt will Transfer Bed to Chair/Chair to Bed: with modified independence PT Transfer Goal: Bed to Chair/Chair to Bed - Progress: Goal set today Pt will Ambulate: >150 feet;Independently PT Goal: Ambulate - Progress: Goal set today  Visit Information  Last PT Received On: 04/09/12 Assistance Needed: +1    Subjective Data  Subjective: I'm leaving tomorrow Patient Stated Goal: Independent-- out of here tomorrow   Prior Functioning  Home Living Lives With: Alone Available Help at Discharge: Friend(s);Available PRN/intermittently Type of Home: Apartment Home Access: Level entry Home Layout: One level Bathroom Shower/Tub: Tub/shower unit;Curtain Firefighter: Standard Bathroom Accessibility: Yes How Accessible: Accessible via walker Home Adaptive Equipment: None Prior Function Level of Independence: Independent Able to Take Stairs?: Yes Driving: Yes Vocation: Unemployed Communication Communication: No difficulties Dominant Hand: Right    Cognition  Overall Cognitive Status: Appears within functional limits for tasks assessed/performed Arousal/Alertness: Lethargic Orientation Level: Oriented X4 / Intact Behavior During Session: Lethargic Cognition - Other Comments: Intact.  Pt just groggy possibly from meds.    Extremity/Trunk Assessment Right Upper Extremity Assessment RUE ROM/Strength/Tone: Within functional levels Left Upper Extremity Assessment LUE ROM/Strength/Tone: Within functional levels Right Lower Extremity Assessment RLE ROM/Strength/Tone: Within functional levels Left Lower  Extremity Assessment LLE ROM/Strength/Tone: Within functional levels   Balance Balance Balance Assessed: No (guarded and tentative, but  not unsteady)  End of Session PT - End of Session Equipment Utilized During Treatment: Back brace Activity Tolerance: Patient tolerated treatment well Patient left: in bed;with call bell/phone within reach;with family/visitor present Nurse Communication: Mobility status   Mikal Wisman, Eliseo Gum 04/09/2012, 2:57 PM  04/09/2012  [place] Bing, PT [phone] (pager)

## 2012-04-09 NOTE — Progress Notes (Signed)
Doing well. C/o appropriate incisional soreness. Much less leg pain No Numbness, tingling, weakness No Nausea /vomiting Not oob yet  Temp:  [97.2 F (36.2 C)-98.2 F (36.8 C)] 97.9 F (36.6 C) (04/24 0551) Pulse Rate:  [71-96] 81  (04/24 0551) Resp:  [11-59] 19  (04/24 0750) BP: (89-130)/(57-81) 90/60 mmHg (04/24 0551) SpO2:  [91 %-98 %] 95 % (04/24 0750) Weight:  [80.1 kg (176 lb 9.4 oz)] 80.1 kg (176 lb 9.4 oz) (04/23 1239) Good strength and sensation Incision CDI  Plan: Increase activity, change to po meds

## 2012-04-09 NOTE — Plan of Care (Signed)
Problem: Consults Goal: Diagnosis - Spinal Surgery Outcome: Completed/Met Date Met:  04/09/12 [place]/Lumbar Spine Fusion

## 2012-04-10 MED ORDER — OXYCODONE-ACETAMINOPHEN 5-325 MG PO TABS: 1.0000 | ORAL_TABLET | ORAL | Status: AC | PRN

## 2012-04-10 MED ORDER — CYCLOBENZAPRINE HCL 10 MG PO TABS: 10.0000 mg | ORAL_TABLET | Freq: Three times a day (TID) | ORAL | Status: AC | PRN

## 2012-04-10 NOTE — Progress Notes (Signed)
Occupational Therapy Treatment Patient Details Name: Katelyn Cook MRN: [id number] DOB: July 18, 1961 Today's Date: 04/10/2012 Time: 1478-2956 OT Time Calculation (min): 33 min  OT Assessment / Plan / Recommendation Comments on Treatment Session Pt doing much better today.  Pt preparing for discharge this afternoon.     Follow Up Recommendations  Home health OT;Supervision - Intermittent    Equipment Recommendations  3 in 1 bedside comode    Frequency Min 2X/week   Plan Discharge plan remains appropriate    Precautions / Restrictions Precautions Precautions: Back Precaution Comments: Reinforced maintaining 3/3 back precautions during ADLs Required Braces or Orthoses: Spinal Brace Spinal Brace: Lumbar corset;Applied in sitting position Restrictions Weight Bearing Restrictions: No   Pertinent Vitals/Pain NA    ADL  Eating/Feeding: Performed;Independent Where Assessed - Eating/Feeding: Edge of bed Grooming: Performed;Wash/dry hands;Modified independent Where Assessed - Grooming: Standing at sink Lower Body Bathing: Simulated;Modified independent Where Assessed - Lower Body Bathing: Sit to stand from chair Lower Body Dressing: Performed;Modified independent Where Assessed - Lower Body Dressing: Sit to stand from chair Toilet Transfer: Performed;Modified independent Toilet Transfer Method: Proofreader: Regular height toilet Toileting - Clothing Manipulation: Performed;Modified independent Where Assessed - Toileting Clothing Manipulation: Standing Toileting - Hygiene: Performed;Modified independent Where Assessed - Toileting Hygiene: Sit on 3-in-1 or toilet Equipment Used: Back brace ADL Comments: Pt donned back brace with supervision for technique while sitting EOB.  Educated pt on safe technique for tub transfer.  Pt declining going to gym to practice tub transfer but states she will wait for [facility] for assistance. Pt able to cross ankles over knees to  access feet for bathing and dressing.  Educated pt on safe techniques during bathing and dressing.      OT Goals ADL Goals Pt Will Perform Grooming: with modified independence;Standing at sink ADL Goal: Grooming - Progress: Met Pt Will Perform Lower Body Bathing: with modified independence;Standing at sink;Sitting at sink ADL Goal: Lower Body Bathing - Progress: Met Pt Will Perform Lower Body Dressing: with modified independence;Sit to stand from chair ADL Goal: Lower Body Dressing - Progress: Met Additional ADL Goal #1: Pt will complete all toileting on 3:1 over commode with modified I. ADL Goal: Additional Goal #1 - Progress: Met  Visit Information  Last OT Received On: 04/10/12 Assistance Needed: +1    Subjective Data      Prior Functioning       Cognition  Overall Cognitive Status: Appears within functional limits for tasks assessed/performed Arousal/Alertness: Awake/alert Orientation Level: Oriented X4 / Intact Behavior During Session: [facility] for tasks performed    Mobility Bed Mobility Bed Mobility: Rolling Right;Right Sidelying to Sit;Sitting - Scoot to [place] of Bed Rolling Right: 6: Modified independent (Device/Increase time) Right Sidelying to Sit: 6: Modified independent (Device/Increase time) Sitting - Scoot to Edge of Bed: 7: Independent Details for Bed Mobility Assistance: able to maintain back precautions  Transfers Transfers: Sit to Stand;Stand to Sit Sit to Stand: 6: Modified independent (Device/Increase time);From bed;With upper extremity assist;From toilet Stand to Sit: 6: Modified independent (Device/Increase time);To chair/3-in-1;To toilet;With armrests;With upper extremity assist   Exercises    Balance    End of Session OT - End of Session Equipment Utilized During Treatment: Back brace Activity Tolerance: Patient tolerated treatment well Patient left: in chair;with call bell/phone within reach Nurse Communication: Mobility status  04/10/2012 Cipriano Mile OTR/L Pager [phone] Office [phone]  Cipriano Mile 04/10/2012, 3:31 PM

## 2012-04-10 NOTE — Progress Notes (Signed)
Patient is d/c today without any complications seen. Vitals stable and assessments remaining unchanged prior to d/c. D/c and medication instructions given. Patient awaiting for transportation.

## 2012-04-10 NOTE — Discharge Summary (Signed)
Physician Discharge Summary  Patient ID: Katelyn Cook MRN: [id number] DOB/AGE: [birth year] 51 y.o.  Admit date: 04/08/2012 Discharge date: 04/10/2012  Admission Diagnoses:Lumbar hnp with myelopathy, Lumbar stenosis, Lumbar spondylosis with myelopathy, lumbar radiculopathy, Lumbago   Discharge Diagnoses: Lumbar hnp with myelopathy, Lumbar stenosis, Lumbar spondylosis with myelopathy, lumbar radiculopathy, Lumbago  Active Problems:  * No active hospital problems. *    Discharged Condition: good  Hospital Course: pt admitted day of surgery, underwent procedure below. Pt with improved leg strength, much less leg pain, ambulating well, eating well, voiding well  Consults: None  Significant Diagnostic Studies: none  Treatments: surgery: Procedure(s):decmpressive lam decompressing the L5 and S1 roots, PLIF L5S1, interbody cages L5S1, non segmented expedium screws L5S1, post lateral fusion L5S1, autograft, infuse, helos, bonemarrow aspirate  POSTERIOR LUMBAR FUSION 1 LEVEL      Discharge Exam: Blood pressure 103/50, pulse 73, temperature 98.1 F (36.7 C), temperature source Oral, resp. rate 18, height 5\' 8"  (1.727 m), weight 80.1 kg (176 lb 9.4 oz), SpO2 100.00%. Wound:c/d/i  Disposition: home   Medication List  As of 04/10/2012 11:34 AM   STOP taking these medications         meloxicam 7.5 MG tablet         TAKE these medications         citalopram 10 MG tablet   Commonly known as: CELEXA   Take 10 mg by mouth daily.      cyclobenzaprine 10 MG tablet   Commonly known as: FLEXERIL   Take 1 tablet (10 mg total) by mouth 3 (three) times daily as needed for muscle spasms.      estradiol 2 MG tablet   Commonly known as: ESTRACE   Take 2 mg by mouth daily.      gabapentin 300 MG capsule   Commonly known as: NEURONTIN   Take 300 mg by mouth 3 (three) times daily.      hydrocodone-acetaminophen 5-500 MG per capsule   Commonly known as: LORCET-HD   Take 1 capsule by mouth  every 6 (six) hours as needed. For pain      losartan 50 MG tablet   Commonly known as: COZAAR   Take 50 mg by mouth daily.      ORENCIA 125 MG/ML Soln   Generic drug: Abatacept   Inject into the skin. 1 injection each month      oxyCODONE-acetaminophen 5-325 MG per tablet   Commonly known as: PERCOCET   Take 1-2 tablets by mouth every 4 (four) hours as needed for pain.      predniSONE 5 MG tablet   Commonly known as: DELTASONE   Take 10 mg by mouth daily.      traMADol 50 MG tablet   Commonly known as: ULTRAM   Take 50 mg by mouth every 6 (six) hours as needed. For pain not to exceed 6 tablets in a 24hr period      VITAMIN B-COMPLEX 100 Inj   Inject as directed. 1 injection each month             Signed: Clydene Fake, MD 04/10/2012, 11:34 AM

## 2012-04-11 MED FILL — Heparin Sodium (Porcine) Inj 1000 Unit/ML: INTRAMUSCULAR | Qty: 30 | Status: AC

## 2012-04-11 MED FILL — Sodium Chloride IV Soln 0.9%: INTRAVENOUS | Qty: 1000 | Status: AC

## 2013-01-22 ENCOUNTER — Encounter (HOSPITAL_BASED_OUTPATIENT_CLINIC_OR_DEPARTMENT_OTHER): Payer: Self-pay | Admitting: *Deleted

## 2013-01-22 ENCOUNTER — Encounter (HOSPITAL_BASED_OUTPATIENT_CLINIC_OR_DEPARTMENT_OTHER)
Admission: RE | Admit: 2013-01-22 | Discharge: 2013-01-22 | Disposition: A | Discharge status: Home / Self Care | Payer: 59 | Source: Ambulatory Visit | Attending: Orthopedic Surgery | Admitting: Orthopedic Surgery

## 2013-01-22 LAB — BASIC METABOLIC PANEL
Calcium: 9.1 mg/dL (ref 8.4–10.5)
GFR calc non Af Amer: >90 mL/min (ref >90)
Sodium: 138 mEq/L (ref 135–145)

## 2013-01-22 NOTE — Progress Notes (Signed)
To come in for bmet-had spinal fusion 4/13-did well-

## 2013-01-28 ENCOUNTER — Encounter (HOSPITAL_BASED_OUTPATIENT_CLINIC_OR_DEPARTMENT_OTHER)
Admission: RE | Disposition: A | Discharge status: Home / Self Care | Payer: Self-pay | Source: Ambulatory Visit | Attending: Orthopedic Surgery

## 2013-01-28 ENCOUNTER — Encounter (HOSPITAL_BASED_OUTPATIENT_CLINIC_OR_DEPARTMENT_OTHER): Payer: Self-pay | Admitting: Anesthesiology

## 2013-01-28 ENCOUNTER — Ambulatory Visit (HOSPITAL_BASED_OUTPATIENT_CLINIC_OR_DEPARTMENT_OTHER): Payer: 59 | Admitting: Anesthesiology

## 2013-01-28 ENCOUNTER — Ambulatory Visit (HOSPITAL_BASED_OUTPATIENT_CLINIC_OR_DEPARTMENT_OTHER)
Admission: RE | Admit: 2013-01-28 | Discharge: 2013-01-28 | Disposition: A | Discharge status: Home / Self Care | Payer: 59 | Source: Ambulatory Visit | Attending: Orthopedic Surgery | Admitting: Orthopedic Surgery

## 2013-01-28 DIAGNOSIS — T8489XA Other specified complication of internal orthopedic prosthetic devices, implants and grafts, initial encounter: Secondary | ICD-10-CM | POA: Insufficient documentation

## 2013-01-28 DIAGNOSIS — M069 Rheumatoid arthritis, unspecified: Secondary | ICD-10-CM | POA: Insufficient documentation

## 2013-01-28 DIAGNOSIS — Z01812 Encounter for preprocedural laboratory examination: Secondary | ICD-10-CM | POA: Insufficient documentation

## 2013-01-28 DIAGNOSIS — I10 Essential (primary) hypertension: Secondary | ICD-10-CM | POA: Insufficient documentation

## 2013-01-28 DIAGNOSIS — Y831 Surgical operation with implant of artificial internal device as the cause of abnormal reaction of the patient, or of later complication, without mention of misadventure at the time of the procedure: Secondary | ICD-10-CM | POA: Insufficient documentation

## 2013-01-28 DIAGNOSIS — F17200 Nicotine dependence, unspecified, uncomplicated: Secondary | ICD-10-CM | POA: Insufficient documentation

## 2013-01-28 DIAGNOSIS — M79674 Pain in right toe(s): Secondary | ICD-10-CM

## 2013-01-28 HISTORY — DX: Presence of dental prosthetic device (complete) (partial): Z97.2

## 2013-01-28 HISTORY — PX: HARDWARE REMOVAL: SHX979

## 2013-01-28 HISTORY — DX: Unspecified asthma, uncomplicated: J45.909

## 2013-01-28 HISTORY — PX: TARSAL METATARSAL ARTHRODESIS: SHX2481

## 2013-01-28 HISTORY — DX: Presence of spectacles and contact lenses: Z97.3

## 2013-01-28 SURGERY — FUSION, TARSOMETATARSAL JOINT
Anesthesia: Regional | Site: Toe | Laterality: Right | Wound class: Clean

## 2013-01-28 MED ORDER — EPHEDRINE SULFATE 50 MG/ML IJ SOLN
INTRAMUSCULAR | Status: DC | PRN
  Administered 2013-01-28 (×3): 10 mg via INTRAVENOUS

## 2013-01-28 MED ORDER — CHLORHEXIDINE GLUCONATE 4 % EX LIQD: 60.0000 mL | Freq: Once | CUTANEOUS | Status: DC

## 2013-01-28 MED ORDER — SODIUM CHLORIDE 0.9 % IJ SOLN
INTRAMUSCULAR | Status: DC | PRN
  Administered 2013-01-28: 2.5 mL via INTRAVENOUS

## 2013-01-28 MED ORDER — MIDAZOLAM HCL 2 MG/2ML IJ SOLN
1.0000 mg | INTRAMUSCULAR | Status: DC | PRN
  Administered 2013-01-28: 2 mg via INTRAVENOUS

## 2013-01-28 MED ORDER — SODIUM CHLORIDE 0.9 % IV SOLN: INTRAVENOUS | Status: DC

## 2013-01-28 MED ORDER — PROPOFOL 10 MG/ML IV BOLUS
INTRAVENOUS | Status: DC | PRN
  Administered 2013-01-28: 130 mg via INTRAVENOUS

## 2013-01-28 MED ORDER — LACTATED RINGERS IV SOLN
INTRAVENOUS | Status: DC
  Administered 2013-01-28 (×2): via INTRAVENOUS

## 2013-01-28 MED ORDER — BUPIVACAINE HCL (PF) 0.5 % IJ SOLN
INTRAMUSCULAR | Status: DC | PRN
  Administered 2013-01-28: 10 mL

## 2013-01-28 MED ORDER — BUPIVACAINE-EPINEPHRINE PF 0.5-1:200000 % IJ SOLN
INTRAMUSCULAR | Status: DC | PRN
  Administered 2013-01-28: 30 mL

## 2013-01-28 MED ORDER — HYDROMORPHONE HCL PF 1 MG/ML IJ SOLN: 0.2500 mg | INTRAMUSCULAR | Status: DC | PRN

## 2013-01-28 MED ORDER — LIDOCAINE HCL (CARDIAC) 20 MG/ML IV SOLN
INTRAVENOUS | Status: DC | PRN
  Administered 2013-01-28: 50 mg via INTRAVENOUS

## 2013-01-28 MED ORDER — OXYCODONE-ACETAMINOPHEN 5-325 MG PO TABS: 1.0000 | ORAL_TABLET | ORAL | Status: DC | PRN

## 2013-01-28 MED ORDER — ASPIRIN EC 325 MG PO TBEC: 325.0000 mg | DELAYED_RELEASE_TABLET | Freq: Two times a day (BID) | ORAL | Status: AC

## 2013-01-28 MED ORDER — FENTANYL CITRATE 0.05 MG/ML IJ SOLN
50.0000 ug | INTRAMUSCULAR | Status: DC | PRN
  Administered 2013-01-28: 100 ug via INTRAVENOUS

## 2013-01-28 MED ORDER — OXYCODONE HCL 5 MG PO TABS: 5.0000 mg | ORAL_TABLET | Freq: Once | ORAL | Status: DC | PRN

## 2013-01-28 MED ORDER — ONDANSETRON HCL 4 MG/2ML IJ SOLN
INTRAMUSCULAR | Status: DC | PRN
  Administered 2013-01-28: 4 mg via INTRAVENOUS

## 2013-01-28 MED ORDER — OXYCODONE HCL 5 MG/5ML PO SOLN: 5.0000 mg | Freq: Once | ORAL | Status: DC | PRN

## 2013-01-28 MED ORDER — VITAMIN C 500 MG PO TABS: 500.0000 mg | ORAL_TABLET | Freq: Every day | ORAL | Status: AC

## 2013-01-28 MED ORDER — VANCOMYCIN HCL IN DEXTROSE 1-5 GM/200ML-% IV SOLN
1000.0000 mg | INTRAVENOUS | Status: AC
  Administered 2013-01-28: 1000 mg via INTRAVENOUS

## 2013-01-28 SURGICAL SUPPLY — 75 items
BANDAGE ELASTIC 4 VELCRO ST LF (GAUZE/BANDAGES/DRESSINGS) ×2 IMPLANT
BANDAGE ELASTIC 6 VELCRO ST LF (GAUZE/BANDAGES/DRESSINGS) ×2 IMPLANT
BIT DRILL CANN F/COMP 2.2 (BIT) ×1 IMPLANT
BLADE CCA MICRO SAG (BLADE) ×2 IMPLANT
BLADE OSC/SAG .038X5.5 CUT EDG (BLADE) IMPLANT
BLADE SURG 11 STRL SS (BLADE) ×2 IMPLANT
BLADE SURG 15 STRL LF DISP TIS (BLADE) ×2 IMPLANT
BLADE SURG 15 STRL SS (BLADE) ×4
BRUSH SCRUB EZ PLAIN DRY (MISCELLANEOUS) ×2 IMPLANT
BUR EGG 3PK/BX (BURR) IMPLANT
CLOTH BEACON ORANGE TIMEOUT ST (SAFETY) ×2 IMPLANT
COTTON STERILE ROLL (GAUZE/BANDAGES/DRESSINGS) ×2 IMPLANT
COVER TABLE BACK 60X90 (DRAPES) ×2 IMPLANT
CUFF TOURNIQUET SINGLE 34IN LL (TOURNIQUET CUFF) ×2 IMPLANT
DRAPE EXTREMITY T 121X128X90 (DRAPE) ×2 IMPLANT
DRAPE OEC MINIVIEW 54X84 (DRAPES) ×2 IMPLANT
DRAPE SURG 17X23 STRL (DRAPES) ×2 IMPLANT
DRSG PAD ABDOMINAL 8X10 ST (GAUZE/BANDAGES/DRESSINGS) ×3 IMPLANT
DURA STEPPER LG (CAST SUPPLIES) IMPLANT
DURA STEPPER MED (CAST SUPPLIES) IMPLANT
DURA STEPPER SML (CAST SUPPLIES) IMPLANT
ELECT REM PT RETURN 9FT ADLT (ELECTROSURGICAL) ×2
ELECTRODE REM PT RTRN 9FT ADLT (ELECTROSURGICAL) ×1 IMPLANT
GAUZE SPONGE 4X4 16PLY XRAY LF (GAUZE/BANDAGES/DRESSINGS) ×2 IMPLANT
GAUZE XEROFORM 1X8 LF (GAUZE/BANDAGES/DRESSINGS) ×2 IMPLANT
GAUZE XEROFORM 5X9 LF (GAUZE/BANDAGES/DRESSINGS) IMPLANT
GLOVE BIO SURGEON STRL SZ 6.5 (GLOVE) ×2 IMPLANT
GLOVE BIO SURGEON STRL SZ7.5 (GLOVE) ×2 IMPLANT
GLOVE BIO SURGEON STRL SZ8 (GLOVE) ×2 IMPLANT
GLOVE BIOGEL PI IND STRL 7.0 (GLOVE) IMPLANT
GLOVE BIOGEL PI IND STRL 8 (GLOVE) ×2 IMPLANT
GLOVE BIOGEL PI INDICATOR 7.0 (GLOVE) ×2
GLOVE BIOGEL PI INDICATOR 8 (GLOVE) ×2
GLOVE SURG SS PI 8.0 STRL IVOR (GLOVE) ×2 IMPLANT
GOWN BRE IMP PREV XXLGXLNG (GOWN DISPOSABLE) ×2 IMPLANT
GOWN PREVENTION PLUS XLARGE (GOWN DISPOSABLE) ×4 IMPLANT
GOWN STRL REIN XL XLG (GOWN DISPOSABLE) ×2 IMPLANT
GUIDEWIRE .045 (WIRE) ×1 IMPLANT
IMPLANT OP-1 (Orthopedic Implant) ×1 IMPLANT
NDL SUT 6 .5 CRC .975X.05 MAYO (NEEDLE) IMPLANT
NEEDLE HYPO 22GX1.5 SAFETY (NEEDLE) IMPLANT
NEEDLE MAYO TAPER (NEEDLE)
NS IRRIG 1000ML POUR BTL (IV SOLUTION) ×2 IMPLANT
PACK BASIN DAY SURGERY FS (CUSTOM PROCEDURE TRAY) ×2 IMPLANT
PAD CAST 4YDX4 CTTN HI CHSV (CAST SUPPLIES) ×1 IMPLANT
PADDING CAST ABS 4INX4YD NS (CAST SUPPLIES) ×1
PADDING CAST ABS COTTON 4X4 ST (CAST SUPPLIES) ×1 IMPLANT
PADDING CAST COTTON 4X4 STRL (CAST SUPPLIES) ×2
PENCIL BUTTON HOLSTER BLD 10FT (ELECTRODE) ×2 IMPLANT
PLATE LP CONTOUR SHORT RT (Plate) ×1 IMPLANT
SCREW LOCKING 3MMX16MM (Screw) ×3 IMPLANT
SCREW LP CORT 3.0X20 (Screw) ×2 IMPLANT
SCREW LP CORT 3.0X22MM (Screw) ×1 IMPLANT
SHEET MEDIUM DRAPE 40X70 STRL (DRAPES) ×4 IMPLANT
SPLINT FAST PLASTER 5X30 (CAST SUPPLIES) ×10
SPLINT PLASTER CAST FAST 5X30 (CAST SUPPLIES) ×1 IMPLANT
SPONGE GAUZE 4X4 12PLY (GAUZE/BANDAGES/DRESSINGS) ×2 IMPLANT
STOCKINETTE 6  STRL (DRAPES) ×1
STOCKINETTE 6 STRL (DRAPES) ×1 IMPLANT
SUCTION FRAZIER TIP 10 FR DISP (SUCTIONS) ×2 IMPLANT
SUT ETHILON 4 0 PS 2 18 (SUTURE) ×3 IMPLANT
SUT PDS AB 4-0 P3 18 (SUTURE) ×1 IMPLANT
SUT VIC AB 2-0 PS2 27 (SUTURE) IMPLANT
SUT VIC AB 3-0 FS2 27 (SUTURE) IMPLANT
SUT VIC AB 3-0 PS1 18 (SUTURE) ×2
SUT VIC AB 3-0 PS1 18XBRD (SUTURE) ×1 IMPLANT
SYR 5ML LL (SYRINGE) ×1 IMPLANT
SYR BULB 3OZ (MISCELLANEOUS) ×2 IMPLANT
SYR CONTROL 10ML LL (SYRINGE) IMPLANT
TOWEL OR 17X24 6PK STRL BLUE (TOWEL DISPOSABLE) ×6 IMPLANT
TOWEL OR 17X26 10 PK STRL BLUE (TOWEL DISPOSABLE) ×2 IMPLANT
TOWEL OR NON WOVEN STRL DISP B (DISPOSABLE) ×2 IMPLANT
TUBE CONNECTING 20X1/4 (TUBING) ×4 IMPLANT
UNDERPAD 30X30 INCONTINENT (UNDERPADS AND DIAPERS) ×2 IMPLANT
WATER STERILE IRR 1000ML POUR (IV SOLUTION) ×2 IMPLANT

## 2013-01-28 NOTE — Anesthesia Procedure Notes (Addendum)
Anesthesia Regional Block:  Popliteal block  Pre-Anesthetic Checklist: ,, timeout performed, Correct Patient, Correct Site, Correct Laterality, Correct Procedure, Correct Position, site marked, Risks and benefits discussed, pre-op evaluation, post-op pain management  Laterality: Right  Prep: Maximum Sterile Barrier Precautions used and chloraprep       Needles:  Injection technique: Single-shot  Needle Type: Echogenic Stimulator Needle     Needle Length: 5cm 5 cm Needle Gauge: 22 and 22 G    Additional Needles:  Procedures: ultrasound guided (picture in chart) and nerve stimulator Popliteal block  Nerve Stimulator or Paresthesia:  Response: Peroneal,  Response: Tibial,   Additional Responses:   Narrative:  Start time: 01/28/2013 8:57 AM End time: 01/28/2013 9:09 AM Injection made incrementally with aspirations every 5 mL. Anesthesiologist: Sampson Goon, MD  Additional Notes: 2% Lidocaine skin wheel. Saphenous block with 10cc of 0.5% Bupivicaine plain.  Popliteal block Procedure Name: LMA Insertion Performed by: Burna Cash Pre-anesthesia Checklist: Patient identified, Emergency Drugs available, Suction available and Patient being monitored Patient Re-evaluated:Patient Re-evaluated prior to inductionOxygen Delivery Method: Circle System Utilized Preoxygenation: Pre-oxygenation with 100% oxygen Intubation Type: IV induction Ventilation: Mask ventilation without difficulty LMA: LMA inserted LMA Size: 4.0 Number of attempts: 1 Airway Equipment and Method: bite block Placement Confirmation: positive ETCO2 Tube secured with: Tape Dental Injury: Teeth and Oropharynx as per pre-operative assessment     Procedure Name: LMA Insertion Date/Time: 01/28/2013 11:24 AM Performed by: Burna Cash Pre-anesthesia Checklist: Patient identified, Emergency Drugs available, Suction available and Patient being monitored Patient Re-evaluated:Patient Re-evaluated prior to  inductionOxygen Delivery Method: Circle System Utilized Preoxygenation: Pre-oxygenation with 100% oxygen Intubation Type: IV induction Ventilation: Mask ventilation without difficulty LMA: LMA inserted LMA Size: 4.0 Number of attempts: 1 Airway Equipment and Method: bite block Placement Confirmation: positive ETCO2 Tube secured with: Tape Dental Injury: Teeth and Oropharynx as per pre-operative assessment

## 2013-01-28 NOTE — Progress Notes (Signed)
Assisted Dr. Fitzgerald with right, ultrasound guided, popliteal/saphenous block. Side rails up, monitors on throughout procedure. See vital signs in flow sheet. Tolerated Procedure well. 

## 2013-01-28 NOTE — Anesthesia Preprocedure Evaluation (Signed)
Anesthesia Evaluation  Patient identified by MRN, date of birth, ID band Patient awake    Reviewed: Allergy & Precautions, H&P , NPO status , Patient's Chart, lab work & pertinent test results  Airway Mallampati: II TM Distance: >3 FB Neck ROM: Full    Dental no notable dental hx. (+) Teeth Intact, Poor Dentition and Dental Advisory Given   Pulmonary Current Smoker,  breath sounds clear to auscultation  Pulmonary exam normal       Cardiovascular hypertension, On Medications Rhythm:Regular Rate:Normal     Neuro/Psych negative neurological ROS  negative psych ROS   GI/Hepatic negative GI ROS, Neg liver ROS,   Endo/Other  negative endocrine ROS  Renal/GU negative Renal ROS  negative genitourinary   Musculoskeletal   Abdominal   Peds  Hematology negative hematology ROS (+)   Anesthesia Other Findings   Reproductive/Obstetrics negative OB ROS                           Anesthesia Physical Anesthesia Plan  ASA: II  Anesthesia Plan: General and Regional   Post-op Pain Management:    Induction: Intravenous  Airway Management Planned: LMA  Additional Equipment:   Intra-op Plan:   Post-operative Plan: Extubation in OR  Informed Consent: I have reviewed the patients History and Physical, chart, labs and discussed the procedure including the risks, benefits and alternatives for the proposed anesthesia with the patient or authorized representative who has indicated his/her understanding and acceptance.   Dental advisory given  Plan Discussed with: CRNA  Anesthesia Plan Comments:         Anesthesia Quick Evaluation

## 2013-01-28 NOTE — Brief Op Note (Signed)
01/28/2013  1:13 PM  PATIENT:  Katelyn Cook  52 y.o. 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   POST-OPERATIVE DIAGNOSIS:  * No post-op diagnosis entered *  PROCEDURE:  Procedure(s) with comments: TARSAL METATARSAL FUSION (Right) - RIGHT GREAT TOE MTP FUSION REVISION WITH DORSAL PLATE, LOCAL BONE GRAFT  HARDWARE REMOVAL (Right) - HARDWARE REMOVAL DEEP LEFT FOOT (GREAT TOE ONLY)  SURGEON:  Surgeon(s) and Role:    * Sherri Rad, MD - Primary  PHYSICIAN ASSISTANT: Rexene Edison, PAC   ASSISTANTS: Rexene Edison, [facility]    ANESTHESIA:   general  EBL:  Total I/O In: 1000 [I.V.:1000] Out: -   BLOOD ADMINISTERED:none  DRAINS: none   LOCAL MEDICATIONS USED:  NONE  SPECIMEN:  No Specimen  DISPOSITION OF SPECIMEN:  N/A  COUNTS:  YES  TOURNIQUET:   Total Tourniquet Time Documented: Thigh (Right) - 91 minutes Total: Thigh (Right) - 91 minutes   DICTATION: .Other Dictation: Dictation Number [id number]  PLAN OF CARE: Discharge to home after PACU  PATIENT DISPOSITION:  PACU - hemodynamically stable.   Delay start of Pharmacological VTE agent (>24hrs) due to surgical blood loss or risk of bleeding: no

## 2013-01-28 NOTE — Anesthesia Postprocedure Evaluation (Signed)
  Anesthesia Post-op Note  Patient: Katelyn Cook  Procedure(s) Performed: Procedure(s) with comments: TARSAL METATARSAL FUSION (Right) - RIGHT GREAT TOE MTP FUSION REVISION WITH DORSAL PLATE, LOCAL BONE GRAFT  HARDWARE REMOVAL (Right) - HARDWARE REMOVAL DEEP LEFT FOOT (GREAT TOE ONLY)  Patient Location: PACU  Anesthesia Type:GA combined with regional for post-op pain  Level of Consciousness: awake, alert  and oriented  Airway and Oxygen Therapy: Patient Spontanous Breathing  Post-op Pain: none  Post-op Assessment: Post-op Vital signs reviewed, Patient's Cardiovascular Status Stable, Respiratory Function Stable, Patent Airway and No signs of Nausea or vomiting  Post-op Vital Signs: Reviewed and stable  Complications: No apparent anesthesia complications

## 2013-01-28 NOTE — H&P (Signed)
  H&P documentation: Placed to be scanned history and physical exam in chart.  -History and Physical Reviewed  -Patient has been re-examined  -No change in the plan of care  Djuna Frechette A  

## 2013-01-28 NOTE — Transfer of Care (Signed)
Immediate Anesthesia Transfer of Care Note  Patient: Katelyn Cook  Procedure(s) Performed: Procedure(s) with comments: TARSAL METATARSAL FUSION (Right) - RIGHT GREAT TOE MTP FUSION REVISION WITH DORSAL PLATE, LOCAL BONE GRAFT  HARDWARE REMOVAL (Right) - HARDWARE REMOVAL DEEP LEFT FOOT (GREAT TOE ONLY)  Patient Location: PACU  Anesthesia Type:GA combined with regional for post-op pain  Level of Consciousness: sedated  Airway & Oxygen Therapy: Patient Spontanous Breathing and Patient connected to face mask oxygen  Post-op Assessment: Report given to PACU RN and Post -op Vital signs reviewed and stable  Post vital signs: Reviewed and stable  Complications: No apparent anesthesia complications

## 2013-01-29 NOTE — Op Note (Signed)
NAME:  Katelyn Cook, Katelyn Cook                  ACCOUNT NO.:  [id number]  MEDICAL RECORD NO.:  [id number]  LOCATION:                                 FACILITY:  PHYSICIAN:  Leonides Grills, M.D.     DATE OF BIRTH:  [birth year]  DATE OF PROCEDURE:  01/28/2013 DATE OF DISCHARGE:                              OPERATIVE REPORT   PREOPERATIVE DIAGNOSES: 1. 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 2. Complication of hardware, right foot.  POSTOPERATIVE DIAGNOSES: 1. 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 2. Complication of hardware, right foot.  OPERATIONS: 1. Revision right great toe metatarsophalangeal joint fusion. 2. Right local bone graft. 3. Hardware removal, deep, right foot.  4.  Stress x-rays, right foot.  ANESTHESIA:  General.  SURGEON:  Leonides Grills, MD  ASSISTANT:  Richardean Canal, PA  ESTIMATED BLOOD LOSS:  Minimal.  COMPLICATIONS:  None.  DISPOSITION:  Stable to PR.  IMPLANT:  Arthrex dorsal plate with locking and nonlocking screws.  INDICATION:  This is a 52 year old female with rheumatoid arthritis and is a smoker.  She had a previous right great toe MTP joint fusion and went onto a nonunion.  She presents today for revision of her great toe MTP joint fusion.  She was consented for the above procedure.  All risks of infection, vessel injury, nonunion, malunion, hardware irritation, hardware failure, persistent pain, worse pain, prolonged recovery, stiffness, arthritis of juxta-articular joints, wound healing problems, DVT, PE were all explained.  Questions were encouraged and answered. She was also encouraged not to smoke.  DESCRIPTION OF PROCEDURE:  The patient was brought to the operating room and placed in supine position after adequate general anesthesia administered as well as Ancef 1 g IV piggyback.  Right lower extremity was then prepped and draped in a sterile manner and proximally placed thigh tourniquet.  Limb was gravity  exsanguinated.  Tourniquet was elevated to 290 mmHg.  A longitudinal incision in the dorsal medial aspect of the right great toe MTP joint was then made.  Dissection was carried down through skin.  Hemostasis was obtained.  Careful dissection was carried down medial to the EHL tendon which was protected within its tenosynovial sheath throughout the case.  Soft tissue was then elevated and a dorsal capsulotomy was made of the MTP joint.  Once this was elevated, we then carefully dissected out the screw from the proximal phalanx as well as from the first metatarsal head.  There was gross motion through the great toe MTP joint and there was an obvious nonunion.  Once the screws were removed which were small fragment screws and they were both intact.  No evidence of hardware failure.  We then removed the mini fragment screw from the metatarsal head.  Both the screws were stainless steel screws.  Once this was done, we then entered the MTP joint.  We removed the soft tissue and fibrous tissue from the area.  There was a large hole within the first metatarsal head.  Once this was prepared, we then placed multiple 2-mm drill holes on either side of the joint.  We then filled in the entire joint with OP-1 graft as  well as local bone graft obtained from drill bits as well as from spur formation.  Once this was packed into place, then the defect within the first metatarsal head was packed in place as well.  We then reduced this under C-arm guidance in AP lateral planes and then provisionally fixed this with a K-wire.  The proximal phalanx was parallel to the weightbearing surface of the foot and the varus valgus plane was corrected as well.  Clinically, the toe was in excellent alignment and the rotation of the toe was in proper alignment to where the IP joint was rotating within the sagittal axis of the great toe.  Once this was done, we then applied a Arthrex dorsal locking/nonlocking titanium  plate on the dorsal aspect of the joint.  Once this was placed in the proper alignment and the actual configuration of plate maintained proper alignment, especially in the sagittal plane of the fusion site, we then sequentially placed screws, first 2 nonlocking screws, tucked on the plate and also compressed the MTP joint.  We then followed this with locking screws, 2 distally and 1 proximally.  We then placed 1 more nonlocking screw within the center of the proximal portion of the plate as well.  Once this was done, we then obtained weightbearing equivalent x-rays to verify that the proximal phalanx was parallel to the weightbearing surface of the foot and the varus valgus plane.  This was maintained in the proper position as well.  Stress x-rays were obtained to verify that there was no gross motion, the fixation was in proper position and excellent alignment as well.  Once this was done, we then packed more OP-1 graft into the first MTP joint as well as into previous screw hole areas where the screws were previously as well.  Prior to placing the graft, the area was copiously irrigated with normal saline. Subcu was closed with 4-0 PDS.  Skin was closed with 4-0 nylon.  After tourniquet deflated, hemostasis was obtained.  Dressing was applied. Modified Jones dressing was applied.  The patient was stable to the PR.     Leonides Grills, M.D.     PB/MEDQ  D:  01/28/2013  T:  01/28/2013  Job:  [id number]

## 2013-02-04 ENCOUNTER — Encounter (HOSPITAL_BASED_OUTPATIENT_CLINIC_OR_DEPARTMENT_OTHER): Payer: Self-pay | Admitting: Orthopedic Surgery

## 2013-06-04 ENCOUNTER — Encounter: Payer: Self-pay | Admitting: Family Medicine

## 2013-06-04 ENCOUNTER — Ambulatory Visit (INDEPENDENT_AMBULATORY_CARE_PROVIDER_SITE_OTHER): Payer: 59 | Admitting: Family Medicine

## 2013-06-04 VITALS — BP 119/77 | HR 78 | Ht 67.0 in | Wt 171.0 lb

## 2013-06-04 DIAGNOSIS — M25519 Pain in unspecified shoulder: Secondary | ICD-10-CM

## 2013-06-04 DIAGNOSIS — R200 Anesthesia of skin: Secondary | ICD-10-CM

## 2013-06-04 DIAGNOSIS — R209 Unspecified disturbances of skin sensation: Secondary | ICD-10-CM

## 2013-06-04 DIAGNOSIS — M25511 Pain in right shoulder: Secondary | ICD-10-CM

## 2013-06-04 NOTE — Patient Instructions (Addendum)
We will order a nerve conduction study for your left arm. We will contact you with the results and the next steps. This may involve bursting prednisone or a cortisone injection around a specific nerve.  You have right rotator cuff impingement Try to avoid painful activities (overhead activities, lifting with extended arm) as much as possible. We may burst prednisone for your left arm which would help with this. Subacromial injection may be beneficial to help with pain and to decrease inflammation. Do home exercise program with theraband and scapular stabilization exercises daily - these are very important for long term relief even if an injection was given.

## 2013-06-05 ENCOUNTER — Encounter: Payer: Self-pay | Admitting: Family Medicine

## 2013-06-05 DIAGNOSIS — R200 Anesthesia of skin: Secondary | ICD-10-CM | POA: Insufficient documentation

## 2013-06-05 DIAGNOSIS — M25511 Pain in right shoulder: Secondary | ICD-10-CM | POA: Insufficient documentation

## 2013-06-05 NOTE — Assessment & Plan Note (Signed)
consistent with rotator cuff impingement.  Shown home strengthening exercises today.  Will consider injection, prednisone burst following NCVs for issue #1.  Consider formal PT.

## 2013-06-05 NOTE — Progress Notes (Addendum)
Patient ID: BECKEY POLKOWSKI, female   DOB: January 24, 1961, 52 y.o.   MRN: [id number]  PCP: Raynelle Jan., MD  Subjective:   HPI: Patient is a 52 y.o. female here for left arm numbness, right shoulder pain.  Patient reports for several months she's had numbness in left upper extremity. States this seemed to start with numbness in left hand first then spread to fingers, now up arm past elbow. All fingers involved - seems circumferential. No neck pain currently though has known disc herniation here. Takes prednisone 10mg  daily for rheumatoid arthritis. Has not had any treatment for current numbness or other workup including nerve conduction studies. Gets the sensation of her left elbow catching at times as well - no swelling, injury.  Also reports right shoulder has been bothering her though not as much as the left arm numbness. Pain is superior shoulder to upper arm. No prior history of right shoulder problems. Difficulty putting arm behind her due to pain. Unable to lie on right side. Is right handed.  Past Medical History  Diagnosis Date  . Hypertension     takes Losartan daily  . Hyperlipidemia     but doesn't take any meds at this time  . Bronchitis     hx of-around 2006  . Neck pain     herniated disc  . Joint pain   . Joint swelling   . Chronic back pain     radiculopathy,lumbago/stenosis/herniated disc  . Bruises easily     pt states d/t being on Prednisone since 2006  . Multiple bruises     on both arms  . Chronic constipation     takes Amitiza prn  . Hemorrhoids   . Urinary incontinence   . Anxiety     takes Citalopram daily  . Staph infection     hx of in 1997  . Wears dentures     top  . Wears partial dentures     lower partial  . Wears glasses   . Pneumonia     hx of in 1992  . Asthma     hx bronchitis-none now  . Rheumatoid arthritis(714.0)     takes Orencia monthly    Current Outpatient Prescriptions on File Prior to Visit  Medication Sig Dispense  Refill  . Abatacept (ORENCIA) 125 MG/ML SOLN Inject into the skin. 1 injection each month      . B Complex Vitamins (VITAMIN B-COMPLEX 100) INJ Inject as directed. 1 injection each month      . citalopram (CELEXA) 10 MG tablet Take 10 mg by mouth daily.      Marland Kitchen estradiol (ESTRACE) 2 MG tablet Take 2 mg by mouth daily.      Marland Kitchen gabapentin (NEURONTIN) 300 MG capsule Take 300 mg by mouth 5 (five) times daily.       Marland Kitchen losartan (COZAAR) 50 MG tablet Take 50 mg by mouth daily.      . meloxicam (MOBIC) 7.5 MG tablet Take 7.5 mg by mouth daily.      . predniSONE (DELTASONE) 5 MG tablet Take 10 mg by mouth daily.      Marland Kitchen aspirin EC 325 MG tablet Take 1 tablet (325 mg total) by mouth 2 (two) times daily.  90 tablet  0  . vitamin C (ASCORBIC ACID) 500 MG tablet Take 1 tablet (500 mg total) by mouth daily.  90 tablet  0   No current facility-administered medications on file prior to visit.    Past Surgical  History  Procedure Laterality Date  . Tonsillectomy      as a child  . Rectal prolapse repair  1981  . Foot surgery  2010    right foot with screws placed   . Foot surgery      bone fusion 2012  . Abdominal hysterectomy  1984  . Colonoscopy    . Esophagogastrectomy    . Diagnostic laparoscopy  early 41's  . Appendectomy    . Back surgery  4/13    lumb fusion  . Tarsal metatarsal arthrodesis Right 01/28/2013    Procedure: TARSAL METATARSAL FUSION;  Surgeon: Sherri Rad, MD;  Location: [facility];  Service: Orthopedics;  Laterality: Right;  RIGHT GREAT TOE MTP FUSION REVISION WITH DORSAL PLATE, LOCAL BONE GRAFT   . Hardware removal Right 01/28/2013    Procedure: HARDWARE REMOVAL;  Surgeon: Sherri Rad, MD;  Location: [facility];  Service: Orthopedics;  Laterality: Right;  HARDWARE REMOVAL DEEP LEFT FOOT (GREAT TOE ONLY)    Allergies  Allergen Reactions  . Erythromycin Other (See Comments)    blisters  . Penicillins Other (See Comments)    blisters  .  Statins Itching    Face red  . Sulfa Antibiotics Rash    itching    History   Social History  . Marital Status: Single    Spouse Name: N/A    Number of Children: N/A  . Years of Education: N/A   Occupational History  . Not on file.   Social History Main Topics  . Smoking status: Current Every Day Smoker -- 1.00 packs/day for 35 years  . Smokeless tobacco: Not on file  . Alcohol Use: No  . Drug Use: No  . Sexually Active: Yes    Birth Control/ Protection: Surgical   Other Topics Concern  . Not on file   Social History Narrative  . No narrative on file    Family History  Problem Relation Age of Onset  . Anesthesia problems Neg Hx   . Hypotension Neg Hx   . Malignant hyperthermia Neg Hx   . Pseudochol deficiency Neg Hx   . Hyperlipidemia Neg Hx   . Heart attack Neg Hx   . Diabetes Neg Hx   . Hypertension Father     BP 119/77  Pulse 78  Ht 5\' 7"  (1.702 m)  Wt 171 lb (77.565 kg)  BMI 26.78 kg/m2  Review of Systems: See HPI above.    Objective:  Physical Exam:  Gen: NAD  L arm: No gross deformity, swelling, bruising. No focal TTP about elbow, wrist, hand. FROM wrist, digits, elbow.  4/5 strength with elbow extension, wrist flexion, extension, thumb opposition and finger extension.  5/5 strength with finger abduction, elbow flexion. Negative tinels at carpal tunnel, cubital tunnel. Negative phalens. Decreased sensation throughout hand, forearm up to elbow.  FROM neck without pain.  R shoulder: No swelling, ecchymoses.  Some supraspinatus atrophy. TTP lateral supraspinatus. No AC, biceps tendon TTP. FROM with painful arc. Positive Hawkins, Neers. Equivocal yergasons. Strength 5/5 with empty can and resisted internal/external rotation.  Pain with empty can and ER. Negative apprehension. NV intact distally.    Assessment & Plan:  1. Left arm numbness - Distribution does not fit with a specific nerve root or peripheral nerve as essentially from  elbow distally.  Will start with NCVs/EMGs of left upper extremity.  Treatment will depend on nerve affected.  2. Right shoulder pain - consistent with rotator  cuff impingement.  Shown home strengthening exercises today.  Will consider injection, prednisone burst following NCVs for issue #1.  Consider formal PT.  Addendum:  MRI results reviewed and discussed with patient.  She has evidence of multilevel cervical radiculopathy especially at C5 nerve root area.  We will start with physical therapy and bursting prednisone but will need a long taper.  Sent this in to her pharmacy.  If not improving after 2 weeks advised to give me a call.  If improving f/u with me in 1 month.

## 2013-06-05 NOTE — Assessment & Plan Note (Signed)
Distribution does not fit with a specific nerve root or peripheral nerve as essentially from elbow distally.  Will start with NCVs/EMGs of left upper extremity.  Treatment will depend on nerve affected.

## 2013-06-10 ENCOUNTER — Encounter: Payer: Self-pay | Admitting: Physical Medicine & Rehabilitation

## 2013-07-10 ENCOUNTER — Encounter: Payer: 59 | Attending: Physical Medicine & Rehabilitation

## 2013-07-10 ENCOUNTER — Encounter: Payer: Self-pay | Admitting: Physical Medicine & Rehabilitation

## 2013-07-10 ENCOUNTER — Ambulatory Visit (HOSPITAL_BASED_OUTPATIENT_CLINIC_OR_DEPARTMENT_OTHER): Payer: 59 | Admitting: Physical Medicine & Rehabilitation

## 2013-07-10 VITALS — BP 107/65 | HR 85 | Resp 14 | Ht 67.0 in | Wt 163.0 lb

## 2013-07-10 DIAGNOSIS — R94131 Abnormal electromyogram [EMG]: Secondary | ICD-10-CM | POA: Insufficient documentation

## 2013-07-10 DIAGNOSIS — R209 Unspecified disturbances of skin sensation: Secondary | ICD-10-CM

## 2013-07-10 DIAGNOSIS — R200 Anesthesia of skin: Secondary | ICD-10-CM

## 2013-07-10 DIAGNOSIS — M810 Age-related osteoporosis without current pathological fracture: Secondary | ICD-10-CM | POA: Insufficient documentation

## 2013-07-10 DIAGNOSIS — M5412 Radiculopathy, cervical region: Secondary | ICD-10-CM

## 2013-07-10 NOTE — Progress Notes (Signed)
EMG performed 07/10/2013.  See EMG report under media tab. Patient complaining of numbness in radial 3 digits of right hand. Deferred EMG eval pending M.D. Referral.

## 2013-07-10 NOTE — Patient Instructions (Signed)
Dr Pearletha Forge will go over report with you He can refer you if he'd like the right arm evaluated

## 2013-07-17 MED ORDER — PREDNISONE (PAK) 10 MG PO TABS: ORAL_TABLET | ORAL | Status: DC

## 2013-07-17 NOTE — Addendum Note (Signed)
Addended by: Lenda Kelp on: 07/17/2013 12:06 PM   Modules accepted: Orders

## 2013-07-21 ENCOUNTER — Ambulatory Visit: Payer: 59 | Admitting: Rehabilitation

## 2013-07-23 ENCOUNTER — Ambulatory Visit: Payer: 59 | Attending: Family Medicine | Admitting: Rehabilitation

## 2013-07-23 DIAGNOSIS — M5412 Radiculopathy, cervical region: Secondary | ICD-10-CM | POA: Insufficient documentation

## 2013-07-23 DIAGNOSIS — IMO0001 Reserved for inherently not codable concepts without codable children: Secondary | ICD-10-CM | POA: Insufficient documentation

## 2013-07-28 ENCOUNTER — Ambulatory Visit: Payer: 59 | Admitting: Rehabilitation

## 2013-07-30 ENCOUNTER — Ambulatory Visit: Payer: 59 | Admitting: Rehabilitation

## 2013-08-18 ENCOUNTER — Ambulatory Visit: Payer: 59 | Attending: Family Medicine | Admitting: Rehabilitation

## 2013-08-18 DIAGNOSIS — IMO0001 Reserved for inherently not codable concepts without codable children: Secondary | ICD-10-CM | POA: Insufficient documentation

## 2013-08-18 DIAGNOSIS — M5412 Radiculopathy, cervical region: Secondary | ICD-10-CM | POA: Insufficient documentation

## 2013-08-29 ENCOUNTER — Encounter (HOSPITAL_BASED_OUTPATIENT_CLINIC_OR_DEPARTMENT_OTHER): Payer: Self-pay | Admitting: Emergency Medicine

## 2013-08-29 ENCOUNTER — Emergency Department (HOSPITAL_BASED_OUTPATIENT_CLINIC_OR_DEPARTMENT_OTHER): Payer: 59

## 2013-08-29 ENCOUNTER — Emergency Department (HOSPITAL_BASED_OUTPATIENT_CLINIC_OR_DEPARTMENT_OTHER)
Admission: EM | Admit: 2013-08-29 | Discharge: 2013-08-29 | Disposition: A | Discharge status: Home / Self Care | Payer: 59 | Attending: Emergency Medicine | Admitting: Emergency Medicine

## 2013-08-29 DIAGNOSIS — Z79899 Other long term (current) drug therapy: Secondary | ICD-10-CM | POA: Insufficient documentation

## 2013-08-29 DIAGNOSIS — Z8619 Personal history of other infectious and parasitic diseases: Secondary | ICD-10-CM | POA: Insufficient documentation

## 2013-08-29 DIAGNOSIS — Y939 Activity, unspecified: Secondary | ICD-10-CM | POA: Insufficient documentation

## 2013-08-29 DIAGNOSIS — F411 Generalized anxiety disorder: Secondary | ICD-10-CM | POA: Insufficient documentation

## 2013-08-29 DIAGNOSIS — Z8719 Personal history of other diseases of the digestive system: Secondary | ICD-10-CM | POA: Insufficient documentation

## 2013-08-29 DIAGNOSIS — Y929 Unspecified place or not applicable: Secondary | ICD-10-CM | POA: Insufficient documentation

## 2013-08-29 DIAGNOSIS — Z8639 Personal history of other endocrine, nutritional and metabolic disease: Secondary | ICD-10-CM | POA: Insufficient documentation

## 2013-08-29 DIAGNOSIS — Z862 Personal history of diseases of the blood and blood-forming organs and certain disorders involving the immune mechanism: Secondary | ICD-10-CM | POA: Insufficient documentation

## 2013-08-29 DIAGNOSIS — Z8701 Personal history of pneumonia (recurrent): Secondary | ICD-10-CM | POA: Insufficient documentation

## 2013-08-29 DIAGNOSIS — S91109A Unspecified open wound of unspecified toe(s) without damage to nail, initial encounter: Secondary | ICD-10-CM | POA: Insufficient documentation

## 2013-08-29 DIAGNOSIS — I10 Essential (primary) hypertension: Secondary | ICD-10-CM | POA: Insufficient documentation

## 2013-08-29 DIAGNOSIS — Z880 Allergy status to penicillin: Secondary | ICD-10-CM | POA: Insufficient documentation

## 2013-08-29 DIAGNOSIS — J45909 Unspecified asthma, uncomplicated: Secondary | ICD-10-CM | POA: Insufficient documentation

## 2013-08-29 DIAGNOSIS — S91119A Laceration without foreign body of unspecified toe without damage to nail, initial encounter: Secondary | ICD-10-CM

## 2013-08-29 DIAGNOSIS — F17200 Nicotine dependence, unspecified, uncomplicated: Secondary | ICD-10-CM | POA: Insufficient documentation

## 2013-08-29 DIAGNOSIS — M069 Rheumatoid arthritis, unspecified: Secondary | ICD-10-CM | POA: Insufficient documentation

## 2013-08-29 DIAGNOSIS — Z7982 Long term (current) use of aspirin: Secondary | ICD-10-CM | POA: Insufficient documentation

## 2013-08-29 DIAGNOSIS — W010XXA Fall on same level from slipping, tripping and stumbling without subsequent striking against object, initial encounter: Secondary | ICD-10-CM | POA: Insufficient documentation

## 2013-08-29 NOTE — ED Provider Notes (Signed)
Medical screening examination/treatment/procedure(s) were conducted as a shared visit with non-physician practitioner(s) and myself.  I personally evaluated the patient during the encounter  Primarily seen by me, laceration performed by Langston Masker.  Dagmar Hait, MD 08/29/13 [phone]

## 2013-08-29 NOTE — ED Notes (Signed)
Had extensive right foot surgery in June.  Tripped over something today, opening an incision between right fourth/fifth toe.  Unable to stop bleeding.  Bleeding is controlled on arrival.

## 2013-08-29 NOTE — ED Provider Notes (Signed)
CSN: [id number]     Arrival date & time 08/29/13  1527 History  This chart was scribed for Dagmar Hait, MD by Ronal Fear, ED Scribe. This patient was seen in room MH05/MH05 and the patient's care was started at 4:52 PM.   Chief Complaint  Patient presents with  . Toe Injury    Patient is a 52 y.o. female presenting with foot injury. The history is provided by the patient. No language interpreter was used.  Foot Injury Location:  Toe Injury: yes   Mechanism of injury: fall   Fall:    Fall occurred:  Tripped   Point of impact:  Feet Toe location:  R fourth toe and R little toe Pain details:    Radiates to:  Does not radiate   Severity:  Mild   Onset quality:  Sudden   Timing:  Constant   Progression:  Unchanged Chronicity:  New Foreign body present:  No foreign bodies Prior injury to area:  Yes Relieved by:  Nothing Worsened by:  Activity Ineffective treatments:  None tried Associated symptoms: decreased ROM   Associated symptoms: no back pain, no neck pain and no swelling     Past Medical History  Diagnosis Date  . Hypertension     takes Losartan daily  . Hyperlipidemia     but doesn't take any meds at this time  . Bronchitis     hx of-around 2006  . Neck pain     herniated disc  . Joint pain   . Joint swelling   . Chronic back pain     radiculopathy,lumbago/stenosis/herniated disc  . Bruises easily     pt states d/t being on Prednisone since 2006  . Multiple bruises     on both arms  . Chronic constipation     takes Amitiza prn  . Hemorrhoids   . Urinary incontinence   . Anxiety     takes Citalopram daily  . Staph infection     hx of in 1997  . Wears dentures     top  . Wears partial dentures     lower partial  . Wears glasses   . Pneumonia     hx of in 1992  . Asthma     hx bronchitis-none now  . Rheumatoid arthritis(714.0)     takes Orencia monthly   Past Surgical History  Procedure Laterality Date  . Tonsillectomy      as a child   . Rectal prolapse repair  1981  . Foot surgery  2010    right foot with screws placed   . Foot surgery      bone fusion 2012  . Abdominal hysterectomy  1984  . Colonoscopy    . Esophagogastrectomy    . Diagnostic laparoscopy  early 89's  . Appendectomy    . Back surgery  4/13    lumb fusion  . Tarsal metatarsal arthrodesis Right 01/28/2013    Procedure: TARSAL METATARSAL FUSION;  Surgeon: Sherri Rad, MD;  Location: [facility];  Service: Orthopedics;  Laterality: Right;  RIGHT GREAT TOE MTP FUSION REVISION WITH DORSAL PLATE, LOCAL BONE GRAFT   . Hardware removal Right 01/28/2013    Procedure: HARDWARE REMOVAL;  Surgeon: Sherri Rad, MD;  Location: [facility];  Service: Orthopedics;  Laterality: Right;  HARDWARE REMOVAL DEEP LEFT FOOT (GREAT TOE ONLY)   Family History  Problem Relation Age of Onset  . Anesthesia problems Neg Hx   .  Hypotension Neg Hx   . Malignant hyperthermia Neg Hx   . Pseudochol deficiency Neg Hx   . Hyperlipidemia Neg Hx   . Heart attack Neg Hx   . Diabetes Neg Hx   . Hypertension Father    History  Substance Use Topics  . Smoking status: Current Every Day Smoker -- 1.00 packs/day for 35 years  . Smokeless tobacco: Not on file  . Alcohol Use: No   OB History   Grav Para Term Preterm Abortions TAB SAB Ect Mult Living                 Review of Systems  HENT: Negative for neck pain.   Musculoskeletal: Negative for back pain.  Skin: Positive for wound.  All other systems reviewed and are negative.    Allergies  Erythromycin; Penicillins; Statins; and Sulfa antibiotics  Home Medications   Current Outpatient Rx  Name  Route  Sig  Dispense  Refill  . Abatacept (ORENCIA) 125 MG/ML SOLN   Subcutaneous   Inject into the skin. 1 injection each month         . albuterol (PROVENTIL HFA;VENTOLIN HFA) 108 (90 BASE) MCG/ACT inhaler   Inhalation   Inhale 2 puffs into the lungs every 6 (six) hours as needed for  wheezing.         Marland Kitchen aspirin EC 325 MG tablet   Oral   Take 1 tablet (325 mg total) by mouth 2 (two) times daily.   90 tablet   0     Remain on aspirin while non weight bearing   . B Complex Vitamins (VITAMIN B-COMPLEX 100) INJ   Injection   Inject as directed. 1 injection each month         . citalopram (CELEXA) 10 MG tablet   Oral   Take 10 mg by mouth daily.         . diazepam (VALIUM) 5 MG tablet               . estradiol (ESTRACE) 2 MG tablet   Oral   Take 2 mg by mouth daily.         Marland Kitchen gabapentin (NEURONTIN) 300 MG capsule   Oral   Take 300 mg by mouth 5 (five) times daily.          Marland Kitchen HYDROcodone-acetaminophen (NORCO/VICODIN) 5-325 MG per tablet   Oral   Take 1 tablet by mouth every 8 (eight) hours as needed for pain.         Marland Kitchen losartan (COZAAR) 50 MG tablet   Oral   Take 50 mg by mouth daily.         . meloxicam (MOBIC) 7.5 MG tablet   Oral   Take 7.5 mg by mouth daily.         . predniSONE (STERAPRED UNI-PAK) 10 MG tablet      6 tabs po daily x 5 days, 5 tabs po daily x 5 days, 4 tabs po daily x 5 days, 3 tabs po daily x 5 days, then 2 tabs po daily x 5 days.   100 tablet   0   . vitamin C (ASCORBIC ACID) 500 MG tablet   Oral   Take 1 tablet (500 mg total) by mouth daily.   90 tablet   0   . zoledronic acid (RECLAST) 5 MG/100ML SOLN   Intravenous   Inject 5 mg into the vein once.  BP 124/64  Pulse 75  Temp(Src) 98.4 F (36.9 C) (Oral)  Resp 22  Ht 5\' 7"  (1.702 m)  Wt 160 lb (72.576 kg)  BMI 25.05 kg/m2  SpO2 93% Physical Exam  Nursing note and vitals reviewed. Constitutional: She is oriented to person, place, and time. She appears well-developed and well-nourished. No distress.  HENT:  Head: Normocephalic and atraumatic.  Eyes: EOM are normal.  Neck: Neck supple. No tracheal deviation present.  Cardiovascular: Normal rate.   Pulmonary/Chest: Effort normal. No respiratory distress.  Musculoskeletal: Normal  range of motion.  2 cm laceration at base of right pinky toe  Neurological: She is alert and oriented to person, place, and time.  Skin: Skin is warm and dry.  Psychiatric: She has a normal mood and affect. Her behavior is normal.    ED Course  Procedures (including critical care time)  DIAGNOSTIC STUDIES: Oxygen Saturation is 93% on RA, adequate by my interpretation.    COORDINATION OF CARE: 4:55 PM- Pt advised of plan for treatment including X-ray and sowing up of the injured area. Pt is to follow up with Podiatrist and pt agrees.      Labs Review Labs Reviewed - No data to display Imaging Review No results found.  MDM  No diagnosis found. 60F s/p toe injury. She tripped over a table and cut the base of her R pinky toe. No head injury, no LOC, no other injury identified. Small 2 cm laceration at 4th/5th webspace at base of R pinky toe. Xray negative. Repaired by Langston Masker.  I personally performed the services described in this documentation, which was scribed in my presence. The recorded information has been reviewed and is accurate.     Dagmar Hait, MD 09/09/13 [phone]

## 2013-08-29 NOTE — ED Provider Notes (Signed)
CSN: [id number]     Arrival date & time 08/29/13  1527 History   First MD Initiated Contact with Patient 08/29/13 1632     Chief Complaint  Patient presents with  . Toe Injury   (Consider location/radiation/quality/duration/timing/severity/associated sxs/prior Treatment) HPI  Past Medical History  Diagnosis Date  . Hypertension     takes Losartan daily  . Hyperlipidemia     but doesn't take any meds at this time  . Bronchitis     hx of-around 2006  . Neck pain     herniated disc  . Joint pain   . Joint swelling   . Chronic back pain     radiculopathy,lumbago/stenosis/herniated disc  . Bruises easily     pt states d/t being on Prednisone since 2006  . Multiple bruises     on both arms  . Chronic constipation     takes Amitiza prn  . Hemorrhoids   . Urinary incontinence   . Anxiety     takes Citalopram daily  . Staph infection     hx of in 1997  . Wears dentures     top  . Wears partial dentures     lower partial  . Wears glasses   . Pneumonia     hx of in 1992  . Asthma     hx bronchitis-none now  . Rheumatoid arthritis(714.0)     takes Orencia monthly   Past Surgical History  Procedure Laterality Date  . Tonsillectomy      as a child  . Rectal prolapse repair  1981  . Foot surgery  2010    right foot with screws placed   . Foot surgery      bone fusion 2012  . Abdominal hysterectomy  1984  . Colonoscopy    . Esophagogastrectomy    . Diagnostic laparoscopy  early 79's  . Appendectomy    . Back surgery  4/13    lumb fusion  . Tarsal metatarsal arthrodesis Right 01/28/2013    Procedure: TARSAL METATARSAL FUSION;  Surgeon: Sherri Rad, MD;  Location: [facility];  Service: Orthopedics;  Laterality: Right;  RIGHT GREAT TOE MTP FUSION REVISION WITH DORSAL PLATE, LOCAL BONE GRAFT   . Hardware removal Right 01/28/2013    Procedure: HARDWARE REMOVAL;  Surgeon: Sherri Rad, MD;  Location: [facility];  Service: Orthopedics;   Laterality: Right;  HARDWARE REMOVAL DEEP LEFT FOOT (GREAT TOE ONLY)   Family History  Problem Relation Age of Onset  . Anesthesia problems Neg Hx   . Hypotension Neg Hx   . Malignant hyperthermia Neg Hx   . Pseudochol deficiency Neg Hx   . Hyperlipidemia Neg Hx   . Heart attack Neg Hx   . Diabetes Neg Hx   . Hypertension Father    History  Substance Use Topics  . Smoking status: Current Every Day Smoker -- 1.00 packs/day for 35 years  . Smokeless tobacco: Not on file  . Alcohol Use: No   OB History   Grav Para Term Preterm Abortions TAB SAB Ect Mult Living                 Review of Systems  Allergies  Erythromycin; Penicillins; Statins; and Sulfa antibiotics  Home Medications   Current Outpatient Rx  Name  Route  Sig  Dispense  Refill  . Abatacept (ORENCIA) 125 MG/ML SOLN   Subcutaneous   Inject into the skin. 1 injection each month         .  albuterol (PROVENTIL HFA;VENTOLIN HFA) 108 (90 BASE) MCG/ACT inhaler   Inhalation   Inhale 2 puffs into the lungs every 6 (six) hours as needed for wheezing.         Marland Kitchen aspirin EC 325 MG tablet   Oral   Take 1 tablet (325 mg total) by mouth 2 (two) times daily.   90 tablet   0     Remain on aspirin while non weight bearing   . B Complex Vitamins (VITAMIN B-COMPLEX 100) INJ   Injection   Inject as directed. 1 injection each month         . citalopram (CELEXA) 10 MG tablet   Oral   Take 10 mg by mouth daily.         . diazepam (VALIUM) 5 MG tablet               . estradiol (ESTRACE) 2 MG tablet   Oral   Take 2 mg by mouth daily.         Marland Kitchen gabapentin (NEURONTIN) 300 MG capsule   Oral   Take 300 mg by mouth 5 (five) times daily.          Marland Kitchen HYDROcodone-acetaminophen (NORCO/VICODIN) 5-325 MG per tablet   Oral   Take 1 tablet by mouth every 8 (eight) hours as needed for pain.         Marland Kitchen losartan (COZAAR) 50 MG tablet   Oral   Take 50 mg by mouth daily.         . meloxicam (MOBIC) 7.5 MG  tablet   Oral   Take 7.5 mg by mouth daily.         . predniSONE (STERAPRED UNI-PAK) 10 MG tablet      6 tabs po daily x 5 days, 5 tabs po daily x 5 days, 4 tabs po daily x 5 days, 3 tabs po daily x 5 days, then 2 tabs po daily x 5 days.   100 tablet   0   . vitamin C (ASCORBIC ACID) 500 MG tablet   Oral   Take 1 tablet (500 mg total) by mouth daily.   90 tablet   0   . zoledronic acid (RECLAST) 5 MG/100ML SOLN   Intravenous   Inject 5 mg into the vein once.          BP 124/64  Pulse 75  Temp(Src) 98.4 F (36.9 C) (Oral)  Resp 22  Ht 5\' 7"  (1.702 m)  Wt 160 lb (72.576 kg)  BMI 25.05 kg/m2  SpO2 93% Physical Exam  ED Course  LACERATION REPAIR Date/Time: 08/29/2013 7:13 PM Performed by: Elson Areas Authorized by: Elson Areas Consent: Verbal consent not obtained. Risks and benefits: risks, benefits and alternatives were discussed Patient identity confirmed: verbally with patient Time out: Immediately prior to procedure a "time out" was called to verify the correct patient, procedure, equipment, support staff and site/side marked as required. Body area: lower extremity Laceration length: 0.4 cm Foreign bodies: no foreign bodies Tendon involvement: none Anesthesia: local infiltration Preparation: Patient was prepped and draped in the usual sterile fashion. Irrigation method: syringe Amount of cleaning: extensive Debridement: none Degree of undermining: none Skin closure: 4-0 Prolene Number of sutures: 2 Technique: simple Approximation: loose Approximation difficulty: simple   (including critical care time) Labs Review Labs Reviewed - No data to display Imaging Review Dg Toe 5th Right  08/29/2013   CLINICAL DATA:  Laceration web space right 5th toe  EXAM: RIGHT FIFTH TOE  COMPARISON:  03/11/2009  FINDINGS: Three views of the right 5th toe submitted. Postsurgical changes are noted 5th metatarsal with metallic fixation screws. There is also prior  resection of distal aspect proximal phalanx right 5th toe. No acute fracture or subluxation.  IMPRESSION: No acute fracture or subluxation. Postsurgical changes as described above.   Electronically Signed   By: Natasha Mead   On: 08/29/2013 17:42    MDM   1. Toe laceration, initial encounter    Suture removal in 8 days    Elson Areas, [place] 08/29/13 1914

## 2013-09-08 ENCOUNTER — Encounter: Payer: Self-pay | Admitting: Family Medicine

## 2013-09-08 ENCOUNTER — Ambulatory Visit (INDEPENDENT_AMBULATORY_CARE_PROVIDER_SITE_OTHER): Payer: 59 | Admitting: Family Medicine

## 2013-09-08 VITALS — BP 122/83 | HR 77 | Ht 67.0 in | Wt 157.0 lb

## 2013-09-08 DIAGNOSIS — R200 Anesthesia of skin: Secondary | ICD-10-CM

## 2013-09-08 DIAGNOSIS — R209 Unspecified disturbances of skin sensation: Secondary | ICD-10-CM

## 2013-09-09 ENCOUNTER — Encounter: Payer: Self-pay | Admitting: Family Medicine

## 2013-09-09 NOTE — Progress Notes (Signed)
Patient ID: Katelyn Cook, female   DOB: 06/26/1961, 52 y.o.   MRN: [id number]  PCP: Raynelle Jan., MD  Subjective:   HPI: Patient is a 52 y.o. female here for f/u left arm numbness, right shoulder pain.  6/19: Patient reports for several months she's had numbness in left upper extremity. States this seemed to start with numbness in left hand first then spread to fingers, now up arm past elbow. All fingers involved - seems circumferential. No neck pain currently though has known disc herniation here. Takes prednisone 10mg  daily for rheumatoid arthritis. Has not had any treatment for current numbness or other workup including nerve conduction studies. Gets the sensation of her left elbow catching at times as well - no swelling, injury.  Also reports right shoulder has been bothering her though not as much as the left arm numbness. Pain is superior shoulder to upper arm. No prior history of right shoulder problems. Difficulty putting arm behind her due to pain. Unable to lie on right side. Is right handed.  9/23: Unfortunately patient did physical therapy and completed prednisone dose pack and did not get relief. Continues to have pain in left side of neck now in addition to the numbness, tingling radiating down left arm. Starting to get some right arm symptoms as well. No bowel/bladder dysfunction.  Past Medical History  Diagnosis Date  . Hypertension     takes Losartan daily  . Hyperlipidemia     but doesn't take any meds at this time  . Bronchitis     hx of-around 2006  . Neck pain     herniated disc  . Joint pain   . Joint swelling   . Chronic back pain     radiculopathy,lumbago/stenosis/herniated disc  . Bruises easily     pt states d/t being on Prednisone since 2006  . Multiple bruises     on both arms  . Chronic constipation     takes Amitiza prn  . Hemorrhoids   . Urinary incontinence   . Anxiety     takes Citalopram daily  . Staph infection     hx of in  1997  . Wears dentures     top  . Wears partial dentures     lower partial  . Wears glasses   . Pneumonia     hx of in 1992  . Asthma     hx bronchitis-none now  . Rheumatoid arthritis(714.0)     takes Orencia monthly    Current Outpatient Prescriptions on File Prior to Visit  Medication Sig Dispense Refill  . Abatacept (ORENCIA) 125 MG/ML SOLN Inject into the skin. 1 injection each month      . albuterol (PROVENTIL HFA;VENTOLIN HFA) 108 (90 BASE) MCG/ACT inhaler Inhale 2 puffs into the lungs every 6 (six) hours as needed for wheezing.      Marland Kitchen aspirin EC 325 MG tablet Take 1 tablet (325 mg total) by mouth 2 (two) times daily.  90 tablet  0  . B Complex Vitamins (VITAMIN B-COMPLEX 100) INJ Inject as directed. 1 injection each month      . citalopram (CELEXA) 10 MG tablet Take 10 mg by mouth daily.      . diazepam (VALIUM) 5 MG tablet       . estradiol (ESTRACE) 2 MG tablet Take 2 mg by mouth daily.      Marland Kitchen gabapentin (NEURONTIN) 300 MG capsule Take 300 mg by mouth 5 (five) times daily.       Marland Kitchen  losartan (COZAAR) 50 MG tablet Take 50 mg by mouth daily.      . meloxicam (MOBIC) 7.5 MG tablet Take 7.5 mg by mouth daily.      . predniSONE (STERAPRED UNI-PAK) 10 MG tablet 6 tabs po daily x 5 days, 5 tabs po daily x 5 days, 4 tabs po daily x 5 days, 3 tabs po daily x 5 days, then 2 tabs po daily x 5 days.  100 tablet  0  . vitamin C (ASCORBIC ACID) 500 MG tablet Take 1 tablet (500 mg total) by mouth daily.  90 tablet  0  . zoledronic acid (RECLAST) 5 MG/100ML SOLN Inject 5 mg into the vein once.       No current facility-administered medications on file prior to visit.    Past Surgical History  Procedure Laterality Date  . Tonsillectomy      as a child  . Rectal prolapse repair  1981  . Foot surgery  2010    right foot with screws placed   . Foot surgery      bone fusion 2012  . Abdominal hysterectomy  1984  . Colonoscopy    . Esophagogastrectomy    . Diagnostic laparoscopy  early  85's  . Appendectomy    . Back surgery  4/13    lumb fusion  . Tarsal metatarsal arthrodesis Right 01/28/2013    Procedure: TARSAL METATARSAL FUSION;  Surgeon: Sherri Rad, MD;  Location: [facility];  Service: Orthopedics;  Laterality: Right;  RIGHT GREAT TOE MTP FUSION REVISION WITH DORSAL PLATE, LOCAL BONE GRAFT   . Hardware removal Right 01/28/2013    Procedure: HARDWARE REMOVAL;  Surgeon: Sherri Rad, MD;  Location: [facility];  Service: Orthopedics;  Laterality: Right;  HARDWARE REMOVAL DEEP LEFT FOOT (GREAT TOE ONLY)    Allergies  Allergen Reactions  . Erythromycin Other (See Comments)    blisters  . Penicillins Other (See Comments)    blisters  . Statins Itching    Face red  . Sulfa Antibiotics Rash    itching    History   Social History  . Marital Status: Single    Spouse Name: N/A    Number of Children: N/A  . Years of Education: N/A   Occupational History  . Not on file.   Social History Main Topics  . Smoking status: Current Every Day Smoker -- 1.00 packs/day for 35 years  . Smokeless tobacco: Not on file  . Alcohol Use: No  . Drug Use: No  . Sexual Activity: Yes    Birth Control/ Protection: Surgical   Other Topics Concern  . Not on file   Social History Narrative  . No narrative on file    Family History  Problem Relation Age of Onset  . Anesthesia problems Neg Hx   . Hypotension Neg Hx   . Malignant hyperthermia Neg Hx   . Pseudochol deficiency Neg Hx   . Hyperlipidemia Neg Hx   . Heart attack Neg Hx   . Diabetes Neg Hx   . Hypertension Father     BP 122/83  Pulse 77  Ht 5\' 7"  (1.702 m)  Wt 157 lb (71.215 kg)  BMI 24.58 kg/m2  Review of Systems: See HPI above.    Objective:  Physical Exam:  Gen: NAD  L arm: No gross deformity, swelling, bruising. No focal TTP about elbow, wrist, hand. FROM wrist, digits, elbow.  4/5 strength with elbow extension, wrist flexion,  extension, thumb opposition and  finger extension.  5/5 strength with finger abduction, elbow flexion. Negative tinels at carpal tunnel, cubital tunnel. Negative phalens. Decreased sensation throughout hand, forearm up to elbow.  Neck: No gross deformity, swelling, bruising. Left cervical paraspinal TTP .  No midline/bony TTP. FROM neck - pain on left lat rotation. Strength diminished all left upper extremity muscle groups - 4/5 though 5-/5 with elbow flexion. Sensation diminished to light touch mainly through hand.   2+ equal reflexes in triceps, biceps, brachioradialis tendons. Negative spurlings.    Assessment & Plan:  1. Left arm numbness - Distribution does not fit with a specific nerve root or peripheral nerve.  She does have some neck pain now and radiation extends from shoulder down to fingers instead of just from elbow.  Evidence of multilevel cervical radiculopathy confirmed by MRI.  Discussed options of ESIs vs neurosurgery referral - she has seen Dr. Phoebe Perch before and would like to start with a consult with him as he helped significantly with her low back pain.  Otherwise f/u with [place] prn.

## 2013-09-09 NOTE — Assessment & Plan Note (Signed)
Distribution does not fit with a specific nerve root or peripheral nerve.  She does have some neck pain now and radiation extends from shoulder down to fingers instead of just from elbow.  Evidence of multilevel cervical radiculopathy confirmed by MRI.  Discussed options of ESIs vs neurosurgery referral - she has seen Dr. Phoebe Perch before and would like to start with a consult with him as he helped significantly with her low back pain.  Otherwise f/u with [place] prn.

## 2013-09-09 NOTE — Patient Instructions (Addendum)
Patient advised we would refer her to neurosurgery as next step.

## 2013-10-22 ENCOUNTER — Other Ambulatory Visit: Payer: Self-pay

## 2013-12-03 ENCOUNTER — Other Ambulatory Visit: Payer: Self-pay | Admitting: Obstetrics & Gynecology

## 2013-12-03 DIAGNOSIS — R928 Other abnormal and inconclusive findings on diagnostic imaging of breast: Secondary | ICD-10-CM

## 2013-12-15 ENCOUNTER — Ambulatory Visit
Admission: RE | Admit: 2013-12-15 | Discharge: 2013-12-15 | Disposition: A | Discharge status: Home / Self Care | Payer: 59 | Source: Ambulatory Visit | Attending: Obstetrics & Gynecology | Admitting: Obstetrics & Gynecology

## 2013-12-15 DIAGNOSIS — R928 Other abnormal and inconclusive findings on diagnostic imaging of breast: Secondary | ICD-10-CM

## 2014-07-09 ENCOUNTER — Encounter (HOSPITAL_BASED_OUTPATIENT_CLINIC_OR_DEPARTMENT_OTHER): Payer: Self-pay | Admitting: Emergency Medicine

## 2014-07-09 ENCOUNTER — Emergency Department (HOSPITAL_BASED_OUTPATIENT_CLINIC_OR_DEPARTMENT_OTHER)
Admission: EM | Admit: 2014-07-09 | Discharge: 2014-07-09 | Disposition: A | Discharge status: Home / Self Care | Payer: 59 | Attending: Emergency Medicine | Admitting: Emergency Medicine

## 2014-07-09 ENCOUNTER — Emergency Department (HOSPITAL_BASED_OUTPATIENT_CLINIC_OR_DEPARTMENT_OTHER): Payer: 59

## 2014-07-09 DIAGNOSIS — M25521 Pain in right elbow: Secondary | ICD-10-CM

## 2014-07-09 DIAGNOSIS — M79609 Pain in unspecified limb: Secondary | ICD-10-CM | POA: Insufficient documentation

## 2014-07-09 DIAGNOSIS — G8929 Other chronic pain: Secondary | ICD-10-CM | POA: Insufficient documentation

## 2014-07-09 DIAGNOSIS — Z8701 Personal history of pneumonia (recurrent): Secondary | ICD-10-CM | POA: Insufficient documentation

## 2014-07-09 DIAGNOSIS — F17200 Nicotine dependence, unspecified, uncomplicated: Secondary | ICD-10-CM | POA: Insufficient documentation

## 2014-07-09 DIAGNOSIS — Z880 Allergy status to penicillin: Secondary | ICD-10-CM | POA: Insufficient documentation

## 2014-07-09 DIAGNOSIS — Z7982 Long term (current) use of aspirin: Secondary | ICD-10-CM | POA: Insufficient documentation

## 2014-07-09 DIAGNOSIS — F411 Generalized anxiety disorder: Secondary | ICD-10-CM | POA: Insufficient documentation

## 2014-07-09 DIAGNOSIS — J45909 Unspecified asthma, uncomplicated: Secondary | ICD-10-CM | POA: Insufficient documentation

## 2014-07-09 DIAGNOSIS — M25529 Pain in unspecified elbow: Secondary | ICD-10-CM | POA: Insufficient documentation

## 2014-07-09 DIAGNOSIS — I10 Essential (primary) hypertension: Secondary | ICD-10-CM | POA: Insufficient documentation

## 2014-07-09 DIAGNOSIS — IMO0002 Reserved for concepts with insufficient information to code with codable children: Secondary | ICD-10-CM | POA: Insufficient documentation

## 2014-07-09 DIAGNOSIS — Z8619 Personal history of other infectious and parasitic diseases: Secondary | ICD-10-CM | POA: Insufficient documentation

## 2014-07-09 DIAGNOSIS — Z79899 Other long term (current) drug therapy: Secondary | ICD-10-CM | POA: Insufficient documentation

## 2014-07-09 DIAGNOSIS — M069 Rheumatoid arthritis, unspecified: Secondary | ICD-10-CM | POA: Insufficient documentation

## 2014-07-09 NOTE — Discharge Instructions (Signed)
Return to the emergency room when the elbow swellsso we can analyze the joint fluid.  Please follow with your primary care doctor in the next 2 days for a check-up. They must obtain records for further management.   Do not hesitate to return to the Emergency Department for any new, worsening or concerning symptoms.

## 2014-07-09 NOTE — ED Provider Notes (Signed)
CSN: [id number]     Arrival date & time 07/09/14  1149 History   First MD Initiated Contact with Patient 07/09/14 1207     Chief Complaint  Patient presents with  . Extremity Pain     (Consider location/radiation/quality/duration/timing/severity/associated sxs/prior Treatment) HPI  Katelyn Cook is a 53 y.o. female with past medical history significant for rheumatoid arthritis by her rheumatologist Jeannetta Nap of [facility]) for evaluation of right elbow swelling. Patient has been hospitalized recently for occult infection. States that she just finished her antibiotics last week. Denies fever, chills, nausea, vomiting. Endorses a generalized fatigue. States that her right elbow intermittently swells over the course of the last month. States there is no swelling or decreased range of motion today.   Past Medical History  Diagnosis Date  . Hypertension     takes Losartan daily  . Hyperlipidemia     but doesn't take any meds at this time  . Bronchitis     hx of-around 2006  . Neck pain     herniated disc  . Joint pain   . Joint swelling   . Chronic back pain     radiculopathy,lumbago/stenosis/herniated disc  . Bruises easily     pt states d/t being on Prednisone since 2006  . Multiple bruises     on both arms  . Chronic constipation     takes Amitiza prn  . Hemorrhoids   . Urinary incontinence   . Anxiety     takes Citalopram daily  . Staph infection     hx of in 1997  . Wears dentures     top  . Wears partial dentures     lower partial  . Wears glasses   . Pneumonia     hx of in 1992  . Asthma     hx bronchitis-none now  . Rheumatoid arthritis(714.0)     takes Orencia monthly   Past Surgical History  Procedure Laterality Date  . Tonsillectomy      as a child  . Rectal prolapse repair  1981  . Foot surgery  2010    right foot with screws placed   . Foot surgery      bone fusion 2012  . Abdominal hysterectomy  1984  . Colonoscopy    . Esophagogastrectomy     . Diagnostic laparoscopy  early 57's  . Appendectomy    . Back surgery  4/13    lumb fusion  . Tarsal metatarsal arthrodesis Right 01/28/2013    Procedure: TARSAL METATARSAL FUSION;  Surgeon: Sherri Rad, MD;  Location: [facility];  Service: Orthopedics;  Laterality: Right;  RIGHT GREAT TOE MTP FUSION REVISION WITH DORSAL PLATE, LOCAL BONE GRAFT   . Hardware removal Right 01/28/2013    Procedure: HARDWARE REMOVAL;  Surgeon: Sherri Rad, MD;  Location: [facility];  Service: Orthopedics;  Laterality: Right;  HARDWARE REMOVAL DEEP LEFT FOOT (GREAT TOE ONLY)   Family History  Problem Relation Age of Onset  . Anesthesia problems Neg Hx   . Hypotension Neg Hx   . Malignant hyperthermia Neg Hx   . Pseudochol deficiency Neg Hx   . Hyperlipidemia Neg Hx   . Heart attack Neg Hx   . Diabetes Neg Hx   . Hypertension Father    History  Substance Use Topics  . Smoking status: Current Every Day Smoker -- 1.00 packs/day for 35 years  . Smokeless tobacco: Not on file  . Alcohol  Use: No   OB History   Grav Para Term Preterm Abortions TAB SAB Ect Mult Living                 Review of Systems  10 systems reviewed and found to be negative, except as noted in the HPI.   Allergies  Erythromycin; Penicillins; Statins; and Sulfa antibiotics  Home Medications   Prior to Admission medications   Medication Sig Start Date End Date Taking? Authorizing Provider  Abatacept (ORENCIA) 125 MG/ML SOLN Inject into the skin. 1 injection each month    Historical Provider, MD  albuterol (PROVENTIL HFA;VENTOLIN HFA) 108 (90 BASE) MCG/ACT inhaler Inhale 2 puffs into the lungs every 6 (six) hours as needed for wheezing.    Historical Provider, MD  aspirin EC 325 MG tablet Take 1 tablet (325 mg total) by mouth 2 (two) times daily. 01/28/13   Richardean Canal, PA-C  B Complex Vitamins (VITAMIN B-COMPLEX 100) INJ Inject as directed. 1 injection each month    Historical Provider, MD    citalopram (CELEXA) 10 MG tablet Take 10 mg by mouth daily.    Historical Provider, MD  diazepam (VALIUM) 5 MG tablet  07/02/13   Historical Provider, MD  estradiol (ESTRACE) 2 MG tablet Take 2 mg by mouth daily.    Historical Provider, MD  gabapentin (NEURONTIN) 300 MG capsule Take 300 mg by mouth 5 (five) times daily.     Historical Provider, MD  HYDROcodone-acetaminophen (NORCO/VICODIN) 5-325 MG per tablet  09/07/13   Historical Provider, MD  losartan (COZAAR) 50 MG tablet Take 50 mg by mouth daily.    Historical Provider, MD  meloxicam (MOBIC) 7.5 MG tablet Take 7.5 mg by mouth daily.    Historical Provider, MD  predniSONE (STERAPRED UNI-PAK) 10 MG tablet 6 tabs po daily x 5 days, 5 tabs po daily x 5 days, 4 tabs po daily x 5 days, 3 tabs po daily x 5 days, then 2 tabs po daily x 5 days. 07/17/13   Lenda Kelp, MD  vitamin C (ASCORBIC ACID) 500 MG tablet Take 1 tablet (500 mg total) by mouth daily. 01/28/13   Richardean Canal, PA-C  zoledronic acid (RECLAST) 5 MG/100ML SOLN Inject 5 mg into the vein once.    Historical Provider, MD   BP 109/73  Pulse 94  Temp(Src) 98.3 F (36.8 C) (Oral)  Resp 20  Ht 5\' 7"  (1.702 m)  Wt 132 lb (59.875 kg)  BMI 20.67 kg/m2  SpO2 96% Physical Exam  Nursing note and vitals reviewed. Constitutional: She is oriented to person, place, and time. She appears well-developed and well-nourished. No distress.  Tobacco smoke, appears older than stated age  HENT:  Head: Normocephalic.  Mouth/Throat: Oropharynx is clear and moist.  Eyes: Conjunctivae and EOM are normal.  Cardiovascular: Normal rate, regular rhythm and intact distal pulses.   Pulmonary/Chest: Effort normal. No stridor.  Abdominal: Soft.  Musculoskeletal: Normal range of motion. She exhibits no edema and no tenderness.  No swelling or deformity to right elbow. The patient has full range of motion however states it is painful. Mild tenderness to palpation along the olecranon.  Neurological: She is  alert and oriented to person, place, and time.  Skin:  Multiple ecchymoses and abrasions.  Psychiatric: She has a normal mood and affect.          ED Course  Procedures (including critical care time) Labs Review Labs Reviewed - No data to display  Imaging Review No results  found.   EKG Interpretation None      MDM   Final diagnoses:  Right elbow pain    Filed Vitals:   07/09/14 1157  BP: 109/73  Pulse: 94  Temp: 98.3 F (36.8 C)  TempSrc: Oral  Resp: 20  Height: 5\' 7"  (1.702 m)  Weight: 132 lb (59.875 kg)  SpO2: 96%    Camisha B Gingerich is a 53 y.o. female presenting for evaluation of right elbow pain and swelling. Patient states swelling has resolved over the last day. No systemic signs of infection. Evaluation with no abnormality to the affected joint aside from mild pain and tenderness palpation. Advise patient that she will need to return to the ED when the swelling occurs so that the joint can be tapped. Patient agrees with care plan. Asked that x-rays be deferred until she comes back so a cow begun at once.  Evaluation does not show pathology that would require ongoing emergent intervention or inpatient treatment. Pt is hemodynamically stable and mentating appropriately. Discussed findings and plan with patient/guardian, who agrees with care plan. All questions answered. Return precautions discussed and outpatient follow up given.     40, PA-C 07/09/14 1251

## 2014-07-09 NOTE — ED Notes (Signed)
Reports MD in [place] advised pt to come to ED for possible infection to right elbow. Sts to have an XR and have fluid drained.

## 2014-07-09 NOTE — ED Provider Notes (Signed)
Medical screening examination/treatment/procedure(s) were performed by non-physician practitioner and as supervising physician I was immediately available for consultation/collaboration.   EKG Interpretation None        Candyce Churn III, MD 07/09/14 1550

## 2014-10-01 ENCOUNTER — Other Ambulatory Visit: Payer: Self-pay

## 2015-05-24 ENCOUNTER — Other Ambulatory Visit: Payer: Self-pay | Admitting: Obstetrics and Gynecology

## 2015-05-25 LAB — CYTOLOGY - PAP

## 2016-05-17 ENCOUNTER — Encounter (HOSPITAL_COMMUNITY): Payer: Self-pay | Admitting: Emergency Medicine

## 2016-05-17 ENCOUNTER — Emergency Department (HOSPITAL_COMMUNITY): Payer: Medicare Other

## 2016-05-17 DIAGNOSIS — R531 Weakness: Secondary | ICD-10-CM | POA: Diagnosis present

## 2016-05-17 DIAGNOSIS — Y92009 Unspecified place in unspecified non-institutional (private) residence as the place of occurrence of the external cause: Secondary | ICD-10-CM | POA: Diagnosis not present

## 2016-05-17 DIAGNOSIS — Z7983 Long term (current) use of bisphosphonates: Secondary | ICD-10-CM | POA: Insufficient documentation

## 2016-05-17 DIAGNOSIS — E785 Hyperlipidemia, unspecified: Secondary | ICD-10-CM | POA: Insufficient documentation

## 2016-05-17 DIAGNOSIS — M549 Dorsalgia, unspecified: Secondary | ICD-10-CM | POA: Insufficient documentation

## 2016-05-17 DIAGNOSIS — D72829 Elevated white blood cell count, unspecified: Secondary | ICD-10-CM | POA: Diagnosis not present

## 2016-05-17 DIAGNOSIS — N390 Urinary tract infection, site not specified: Principal | ICD-10-CM | POA: Insufficient documentation

## 2016-05-17 DIAGNOSIS — Z79899 Other long term (current) drug therapy: Secondary | ICD-10-CM | POA: Insufficient documentation

## 2016-05-17 DIAGNOSIS — E43 Unspecified severe protein-calorie malnutrition: Secondary | ICD-10-CM | POA: Diagnosis not present

## 2016-05-17 DIAGNOSIS — F329 Major depressive disorder, single episode, unspecified: Secondary | ICD-10-CM | POA: Diagnosis not present

## 2016-05-17 DIAGNOSIS — W19XXXA Unspecified fall, initial encounter: Secondary | ICD-10-CM | POA: Diagnosis not present

## 2016-05-17 DIAGNOSIS — F419 Anxiety disorder, unspecified: Secondary | ICD-10-CM | POA: Insufficient documentation

## 2016-05-17 DIAGNOSIS — R112 Nausea with vomiting, unspecified: Secondary | ICD-10-CM | POA: Diagnosis not present

## 2016-05-17 DIAGNOSIS — F17210 Nicotine dependence, cigarettes, uncomplicated: Secondary | ICD-10-CM | POA: Insufficient documentation

## 2016-05-17 DIAGNOSIS — M069 Rheumatoid arthritis, unspecified: Secondary | ICD-10-CM | POA: Diagnosis not present

## 2016-05-17 DIAGNOSIS — R251 Tremor, unspecified: Secondary | ICD-10-CM | POA: Insufficient documentation

## 2016-05-17 DIAGNOSIS — K5909 Other constipation: Secondary | ICD-10-CM | POA: Insufficient documentation

## 2016-05-17 DIAGNOSIS — G8929 Other chronic pain: Secondary | ICD-10-CM | POA: Diagnosis not present

## 2016-05-17 DIAGNOSIS — Z79890 Hormone replacement therapy (postmenopausal): Secondary | ICD-10-CM | POA: Insufficient documentation

## 2016-05-17 DIAGNOSIS — Z7982 Long term (current) use of aspirin: Secondary | ICD-10-CM | POA: Insufficient documentation

## 2016-05-17 DIAGNOSIS — I10 Essential (primary) hypertension: Secondary | ICD-10-CM | POA: Diagnosis not present

## 2016-05-17 DIAGNOSIS — Z681 Body mass index (BMI) 19 or less, adult: Secondary | ICD-10-CM | POA: Insufficient documentation

## 2016-05-17 DIAGNOSIS — R197 Diarrhea, unspecified: Secondary | ICD-10-CM | POA: Insufficient documentation

## 2016-05-17 DIAGNOSIS — Z791 Long term (current) use of non-steroidal anti-inflammatories (NSAID): Secondary | ICD-10-CM | POA: Insufficient documentation

## 2016-05-17 DIAGNOSIS — E876 Hypokalemia: Secondary | ICD-10-CM | POA: Diagnosis not present

## 2016-05-17 DIAGNOSIS — J449 Chronic obstructive pulmonary disease, unspecified: Secondary | ICD-10-CM | POA: Insufficient documentation

## 2016-05-17 DIAGNOSIS — S62012A Displaced fracture of distal pole of navicular [scaphoid] bone of left wrist, initial encounter for closed fracture: Secondary | ICD-10-CM | POA: Insufficient documentation

## 2016-05-17 MED ORDER — OXYCODONE-ACETAMINOPHEN 5-325 MG PO TABS
ORAL_TABLET | ORAL | Status: AC
  Filled 2016-05-17: qty 1

## 2016-05-17 MED ORDER — OXYCODONE-ACETAMINOPHEN 5-325 MG PO TABS
1.0000 | ORAL_TABLET | ORAL | Status: DC | PRN
  Administered 2016-05-17: 1 via ORAL

## 2016-05-17 NOTE — ED Notes (Signed)
Pt states she has a history of RA and she is having increase joint pain and hand tremors. Pt states when her pain flares she has these tremors. Pt also reports breaking out into sweats and just not feeling well. Pt states, "my body feels on fire"

## 2016-05-17 NOTE — ED Notes (Signed)
Pt also reports she fell last week because of hip pain and having pain in left wrist.

## 2016-05-18 ENCOUNTER — Observation Stay (HOSPITAL_COMMUNITY)
Admission: EM | Admit: 2016-05-18 | Discharge: 2016-05-20 | Disposition: A | Discharge status: Home Health | Payer: Medicare Other | Attending: Internal Medicine | Admitting: Internal Medicine

## 2016-05-18 ENCOUNTER — Observation Stay (HOSPITAL_COMMUNITY): Payer: Medicare Other

## 2016-05-18 ENCOUNTER — Encounter (HOSPITAL_COMMUNITY): Payer: Self-pay | Admitting: Internal Medicine

## 2016-05-18 DIAGNOSIS — N390 Urinary tract infection, site not specified: Secondary | ICD-10-CM | POA: Diagnosis present

## 2016-05-18 DIAGNOSIS — J4520 Mild intermittent asthma, uncomplicated: Secondary | ICD-10-CM | POA: Diagnosis not present

## 2016-05-18 DIAGNOSIS — J45909 Unspecified asthma, uncomplicated: Secondary | ICD-10-CM | POA: Diagnosis present

## 2016-05-18 DIAGNOSIS — E43 Unspecified severe protein-calorie malnutrition: Secondary | ICD-10-CM | POA: Diagnosis present

## 2016-05-18 DIAGNOSIS — Z720 Tobacco use: Secondary | ICD-10-CM

## 2016-05-18 DIAGNOSIS — S62102A Fracture of unspecified carpal bone, left wrist, initial encounter for closed fracture: Secondary | ICD-10-CM | POA: Diagnosis present

## 2016-05-18 DIAGNOSIS — F419 Anxiety disorder, unspecified: Secondary | ICD-10-CM | POA: Diagnosis not present

## 2016-05-18 DIAGNOSIS — M25559 Pain in unspecified hip: Secondary | ICD-10-CM

## 2016-05-18 DIAGNOSIS — R112 Nausea with vomiting, unspecified: Secondary | ICD-10-CM | POA: Diagnosis present

## 2016-05-18 DIAGNOSIS — M069 Rheumatoid arthritis, unspecified: Secondary | ICD-10-CM | POA: Diagnosis present

## 2016-05-18 DIAGNOSIS — E876 Hypokalemia: Secondary | ICD-10-CM | POA: Diagnosis present

## 2016-05-18 DIAGNOSIS — S62002A Unspecified fracture of navicular [scaphoid] bone of left wrist, initial encounter for closed fracture: Secondary | ICD-10-CM

## 2016-05-18 DIAGNOSIS — R197 Diarrhea, unspecified: Secondary | ICD-10-CM

## 2016-05-18 DIAGNOSIS — I10 Essential (primary) hypertension: Secondary | ICD-10-CM | POA: Diagnosis present

## 2016-05-18 HISTORY — DX: Tobacco use: Z72.0

## 2016-05-18 LAB — URINALYSIS, ROUTINE W REFLEX MICROSCOPIC
BILIRUBIN URINE: NEGATIVE
Glucose, UA: NEGATIVE mg/dL
Hgb urine dipstick: NEGATIVE
KETONES UR: NEGATIVE mg/dL
NITRITE: NEGATIVE
PH: 6.5 (ref 5.0–8.0)
PROTEIN: NEGATIVE mg/dL
Specific Gravity, Urine: 1.004 — ABNORMAL LOW (ref 1.005–1.030)

## 2016-05-18 LAB — LACTIC ACID, PLASMA
LACTIC ACID, VENOUS: 1.1 mmol/L (ref 0.5–2.0)
Lactic Acid, Venous: 1.1 mmol/L (ref 0.5–2.0)

## 2016-05-18 LAB — BASIC METABOLIC PANEL
ANION GAP: 10 (ref 5–15)
ANION GAP: 9 (ref 5–15)
BUN: 6 mg/dL (ref 6–20)
CALCIUM: 8.7 mg/dL — AB (ref 8.9–10.3)
CHLORIDE: 102 mmol/L (ref 101–111)
CO2: 30 mmol/L (ref 22–32)
CO2: 28 mmol/L (ref 22–32)
CREATININE: 0.71 mg/dL (ref 0.44–1.00)
Calcium: 8.2 mg/dL — ABNORMAL LOW (ref 8.9–10.3)
Chloride: 99 mmol/L — ABNORMAL LOW (ref 101–111)
Creatinine, Ser: 0.76 mg/dL (ref 0.44–1.00)
GFR calc Af Amer: >60 mL/min (ref >60)
GFR calc non Af Amer: >60 mL/min (ref >60)
Glucose, Bld: 89 mg/dL (ref 65–99)
Glucose, Bld: 88 mg/dL (ref 65–99)
POTASSIUM: 2.3 mmol/L — AB (ref 3.5–5.1)
POTASSIUM: 2.9 mmol/L — AB (ref 3.5–5.1)
SODIUM: 139 mmol/L (ref 135–145)
SODIUM: 139 mmol/L (ref 135–145)

## 2016-05-18 LAB — APTT: aPTT: 30 seconds (ref 24–37)

## 2016-05-18 LAB — CBC
HEMATOCRIT: 34.2 % — AB (ref 36.0–46.0)
HEMOGLOBIN: 10.2 g/dL — AB (ref 12.0–15.0)
MCH: 28 pg (ref 26.0–34.0)
MCHC: 29.8 g/dL — ABNORMAL LOW (ref 30.0–36.0)
MCV: 94 fL (ref 78.0–100.0)
Platelets: 406 10*3/uL — ABNORMAL HIGH (ref 150–400)
RBC: 3.64 MIL/uL — AB (ref 3.87–5.11)
RDW: 17.5 % — ABNORMAL HIGH (ref 11.5–15.5)
WBC: 12.4 10*3/uL — AB (ref 4.0–10.5)

## 2016-05-18 LAB — CBC WITH DIFFERENTIAL/PLATELET
BASOS ABS: 0 10*3/uL (ref 0.0–0.1)
Basophils Relative: 0 %
EOS ABS: 0.1 10*3/uL (ref 0.0–0.7)
Eosinophils Relative: 1 %
HEMATOCRIT: 35.2 % — AB (ref 36.0–46.0)
Hemoglobin: 10.9 g/dL — ABNORMAL LOW (ref 12.0–15.0)
LYMPHS ABS: 5.3 10*3/uL — AB (ref 0.7–4.0)
LYMPHS PCT: 42 %
MCH: 28.4 pg (ref 26.0–34.0)
MCHC: 31 g/dL (ref 30.0–36.0)
MCV: 91.7 fL (ref 78.0–100.0)
MONOS PCT: 13 %
Monocytes Absolute: 1.7 10*3/uL — ABNORMAL HIGH (ref 0.1–1.0)
NEUTROS ABS: 5.6 10*3/uL (ref 1.7–7.7)
Neutrophils Relative %: 44 %
Platelets: 473 10*3/uL — ABNORMAL HIGH (ref 150–400)
RBC: 3.84 MIL/uL — AB (ref 3.87–5.11)
RDW: 17.4 % — AB (ref 11.5–15.5)
WBC: 12.7 10*3/uL — AB (ref 4.0–10.5)

## 2016-05-18 LAB — URINE MICROSCOPIC-ADD ON

## 2016-05-18 LAB — PROTIME-INR
INR: 1.06 (ref 0.00–1.49)
Prothrombin Time: 14 seconds (ref 11.6–15.2)

## 2016-05-18 LAB — MAGNESIUM: Magnesium: 1.8 mg/dL (ref 1.7–2.4)

## 2016-05-18 LAB — PROCALCITONIN

## 2016-05-18 MED ORDER — NICOTINE 21 MG/24HR TD PT24
21.0000 mg | MEDICATED_PATCH | Freq: Every day | TRANSDERMAL | Status: DC
  Administered 2016-05-18 – 2016-05-20 (×3): 21 mg via TRANSDERMAL
  Filled 2016-05-18 (×3): qty 1

## 2016-05-18 MED ORDER — VITAMIN C 500 MG PO TABS
500.0000 mg | ORAL_TABLET | Freq: Every day | ORAL | Status: DC
  Administered 2016-05-18 – 2016-05-20 (×3): 500 mg via ORAL
  Filled 2016-05-18 (×3): qty 1

## 2016-05-18 MED ORDER — ENOXAPARIN SODIUM 40 MG/0.4ML ~~LOC~~ SOLN
40.0000 mg | SUBCUTANEOUS | Status: DC
Start: 2016-05-18 — End: 2016-05-20
  Administered 2016-05-18 – 2016-05-19 (×2): 40 mg via SUBCUTANEOUS
  Filled 2016-05-18 (×2): qty 0.4

## 2016-05-18 MED ORDER — DIAZEPAM 5 MG PO TABS
5.0000 mg | ORAL_TABLET | Freq: Two times a day (BID) | ORAL | Status: DC | PRN
  Administered 2016-05-18 – 2016-05-19 (×2): 5 mg via ORAL
  Filled 2016-05-18 (×2): qty 1

## 2016-05-18 MED ORDER — ALBUTEROL SULFATE (2.5 MG/3ML) 0.083% IN NEBU: 3.0000 mL | INHALATION_SOLUTION | Freq: Four times a day (QID) | RESPIRATORY_TRACT | Status: DC | PRN

## 2016-05-18 MED ORDER — ASPIRIN EC 325 MG PO TBEC
325.0000 mg | DELAYED_RELEASE_TABLET | Freq: Two times a day (BID) | ORAL | Status: DC
  Administered 2016-05-18 – 2016-05-20 (×5): 325 mg via ORAL
  Filled 2016-05-18 (×5): qty 1

## 2016-05-18 MED ORDER — ONDANSETRON HCL 4 MG PO TABS: 4.0000 mg | ORAL_TABLET | Freq: Four times a day (QID) | ORAL | Status: DC | PRN

## 2016-05-18 MED ORDER — ONDANSETRON HCL 4 MG/2ML IJ SOLN: 4.0000 mg | Freq: Four times a day (QID) | INTRAMUSCULAR | Status: DC | PRN

## 2016-05-18 MED ORDER — SODIUM CHLORIDE 0.9 % IV BOLUS (SEPSIS)
1500.0000 mL | Freq: Once | INTRAVENOUS | Status: AC
  Administered 2016-05-18: 1000 mL via INTRAVENOUS

## 2016-05-18 MED ORDER — ACETAMINOPHEN 325 MG PO TABS
650.0000 mg | ORAL_TABLET | Freq: Four times a day (QID) | ORAL | Status: DC | PRN
  Administered 2016-05-18: 650 mg via ORAL
  Filled 2016-05-18: qty 2

## 2016-05-18 MED ORDER — DEXTROSE 5 % IV SOLN
1.0000 g | Freq: Three times a day (TID) | INTRAVENOUS | Status: DC
  Administered 2016-05-18 – 2016-05-19 (×4): 1 g via INTRAVENOUS
  Filled 2016-05-18 (×8): qty 1

## 2016-05-18 MED ORDER — SODIUM CHLORIDE 0.9 % IV SOLN
INTRAVENOUS | Status: DC
  Administered 2016-05-18 – 2016-05-19 (×3): via INTRAVENOUS

## 2016-05-18 MED ORDER — ACETAMINOPHEN 650 MG RE SUPP: 650.0000 mg | Freq: Four times a day (QID) | RECTAL | Status: DC | PRN

## 2016-05-18 MED ORDER — LOSARTAN POTASSIUM 50 MG PO TABS
50.0000 mg | ORAL_TABLET | Freq: Every day | ORAL | Status: DC
  Administered 2016-05-18 – 2016-05-20 (×3): 50 mg via ORAL
  Filled 2016-05-18 (×3): qty 1

## 2016-05-18 MED ORDER — POTASSIUM CHLORIDE CRYS ER 20 MEQ PO TBCR
40.0000 meq | EXTENDED_RELEASE_TABLET | Freq: Once | ORAL | Status: AC
  Administered 2016-05-18: 40 meq via ORAL
  Filled 2016-05-18: qty 2

## 2016-05-18 MED ORDER — POTASSIUM CHLORIDE 10 MEQ/100ML IV SOLN
10.0000 meq | Freq: Once | INTRAVENOUS | Status: AC
  Administered 2016-05-18: 10 meq via INTRAVENOUS
  Filled 2016-05-18: qty 100

## 2016-05-18 MED ORDER — FAMOTIDINE 20 MG PO TABS
20.0000 mg | ORAL_TABLET | Freq: Every day | ORAL | Status: DC
  Administered 2016-05-18 – 2016-05-20 (×3): 20 mg via ORAL
  Filled 2016-05-18 (×3): qty 1

## 2016-05-18 MED ORDER — CITALOPRAM HYDROBROMIDE 10 MG PO TABS
10.0000 mg | ORAL_TABLET | Freq: Every day | ORAL | Status: DC
  Administered 2016-05-18 – 2016-05-20 (×3): 10 mg via ORAL
  Filled 2016-05-18 (×3): qty 1

## 2016-05-18 MED ORDER — B COMPLEX-C PO TABS
1.0000 | ORAL_TABLET | Freq: Every day | ORAL | Status: DC
  Administered 2016-05-18 – 2016-05-20 (×3): 1 via ORAL
  Filled 2016-05-18 (×3): qty 1

## 2016-05-18 MED ORDER — MAGNESIUM SULFATE 2 GM/50ML IV SOLN
2.0000 g | Freq: Once | INTRAVENOUS | Status: AC
  Administered 2016-05-18: 2 g via INTRAVENOUS
  Filled 2016-05-18: qty 50

## 2016-05-18 MED ORDER — ENSURE ENLIVE PO LIQD
237.0000 mL | Freq: Three times a day (TID) | ORAL | Status: DC
  Administered 2016-05-18 – 2016-05-20 (×6): 237 mL via ORAL

## 2016-05-18 MED ORDER — ENSURE ENLIVE PO LIQD
237.0000 mL | Freq: Two times a day (BID) | ORAL | Status: DC
  Administered 2016-05-18 (×2): 237 mL via ORAL

## 2016-05-18 MED ORDER — GABAPENTIN 300 MG PO CAPS
300.0000 mg | ORAL_CAPSULE | Freq: Every day | ORAL | Status: DC
  Administered 2016-05-18 – 2016-05-19 (×7): 300 mg via ORAL
  Filled 2016-05-18 (×7): qty 1

## 2016-05-18 MED ORDER — SODIUM CHLORIDE 0.9% FLUSH
3.0000 mL | Freq: Two times a day (BID) | INTRAVENOUS | Status: DC
  Administered 2016-05-18: 3 mL via INTRAVENOUS

## 2016-05-18 MED ORDER — ESTRADIOL 2 MG PO TABS
2.0000 mg | ORAL_TABLET | Freq: Every day | ORAL | Status: DC
  Administered 2016-05-18 – 2016-05-20 (×3): 2 mg via ORAL
  Filled 2016-05-18: qty 1
  Filled 2016-05-18 (×2): qty 2
  Filled 2016-05-18: qty 1
  Filled 2016-05-18: qty 2
  Filled 2016-05-18: qty 1

## 2016-05-18 MED ORDER — OXYCODONE-ACETAMINOPHEN 5-325 MG PO TABS
1.0000 | ORAL_TABLET | ORAL | Status: AC | PRN
  Administered 2016-05-18: 1 via ORAL
  Filled 2016-05-18: qty 1

## 2016-05-18 MED ORDER — MELOXICAM 7.5 MG PO TABS
7.5000 mg | ORAL_TABLET | Freq: Every day | ORAL | Status: DC
  Administered 2016-05-18 – 2016-05-19 (×2): 7.5 mg via ORAL
  Filled 2016-05-18 (×2): qty 1

## 2016-05-18 MED ORDER — PREDNISONE 20 MG PO TABS
10.0000 mg | ORAL_TABLET | Freq: Every day | ORAL | Status: AC
  Administered 2016-05-18 – 2016-05-20 (×3): 10 mg via ORAL
  Filled 2016-05-18 (×3): qty 1

## 2016-05-18 MED ORDER — ALBUTEROL SULFATE (2.5 MG/3ML) 0.083% IN NEBU: 2.5000 mg | INHALATION_SOLUTION | Freq: Four times a day (QID) | RESPIRATORY_TRACT | Status: DC

## 2016-05-18 NOTE — Care Management Obs Status (Signed)
MEDICARE OBSERVATION STATUS NOTIFICATION   Patient Details  Name: Katelyn Cook MRN: [id number] Date of Birth: Jun 04, 1961   Medicare Observation Status Notification Given:  Yes    Kingsley Plan, RN 05/18/2016, 2:56 PM

## 2016-05-18 NOTE — Progress Notes (Signed)
  PROGRESS NOTE    KRISSIA SCHREIER  OEV:[id number] DOB: 10/07/1961 DOA: 05/18/2016 PCP: Raynelle Jan., MD   Brief Narrative:  On 05/18/2016 by Dr. Lorretta Harp EIRA ALPERT is a 55 y.o. female with medical history significant of rheumatoid arthritis, hypertension, hyperlipidemia, asthma, tobacco abuse, depression, anxiety, chronic back pain, who presents with increased urinary frequency, nausea, vomiting, diarrhea, generalized weakness, pain all over, fall and left wrist pain.  Patient reports that she has been having nausea, vomiting, diarrhea for more a week. She vomited 1 to 2 times each day without blood in the vomitus. She has 2 or 3 bowel movements each day with loose stool. Patient does not have abdominal pain. Patient states that she used antibiotics recently, but she cannot provide a detailed information. She also has increased urinary frequency recently, but no dysuria or burning on urination. She states that she used to take Orencia for rheumatoid arthritis with well control of her joint pain, but she stopped taking his medication 2015 since she could not afford it anymore. She states that she has pain all over, including bilateral hip, lower back, bilateral hands and feet. She feels very weak recently. She states that she had a fall last week due to weakness, and injured her left wrist. She has severe pain over left wrist, 10 out of 10, constant, nonradiating. It is aggravated by movement. Patient does not have unilateral weakness, vision change or hearing loss. No chest pain, shortness of breath, cough. Patient has mild subjective fever, but no chills. Assessment & Plan  Patient admitted earlier today by Dr. Lorretta Harp. See H&P for details, agree with assessment and plan.   UTI (lower urinary tract infection)  -Patient has increased urinary frequency and positive urinalysis, consistent with UTI, which may have contributed to his generalized weakness. -Continue aztreonam IV -Blood and urine  cultures pending  Nausea vomiting and diarrhea -Etiology is not clear, ?gastroenteritis.  -Patient states that she used antibiotics recently, but she cannot provide the detailed information.  -C diff pcr and GI path panel pending -Continue antiemetics PRN  Hypokalemia  -Potassium 2.3 and magnesium 1.8 on admission. -Continue to replace and monitor  Hypertension -continue losartan  Rheumatoid arthritis -used to be well controled with Orencia, unfortunately patient could not afford it   -She has not been seeing her rheumatologist -will treat with prednisone 10 mg daily x 3 days -Continue home meds  Depression and anxiety -Stable -Continue Valium, Celexa  Asthma -stable, continue albuterol PRN   Closed fracture of left wrist  -X-ray of her left wrist showed comminuted fracture through the distal pole of the scaphoid, with mildly displaced fragments -Occurred one week ago -Spoke with ortho, recommended outpatient follow up -Continue pain control  Protein-calorie malnutrition, severe (HCC) -Nutrition consulted   Tobacco abuse -Smoking cessation discussed -Nicotine patch  Chronic pain, recent fall -Will xray hips/back -PT consulted and appreciated   DVT Prophylaxis  Lovenox  Code Status: Full  Family Communication: Husband at bedside (who continuously speaks for and over the patient)  Disposition Plan: Admitted for observation  Consultants Ortho, Hand surgery  Procedures  None  Time Spent in minutes   30 minutes  Areyanna Figeroa D.O. on 05/18/2016 at 2:20 PM  Between 7am to 7pm - Pager - [phone]  After 7pm go to [email or web address] - password [email or web address]  And look for the night coverage person covering for me after hours  [facility] Office  [phone]

## 2016-05-18 NOTE — Progress Notes (Signed)
Pharmacy Antibiotic Note  Katelyn Cook is a 55 y.o. female admitted on 05/18/2016 with UTI.  Pharmacy has been consulted for aztreonam dosing.  Plan: Aztreonam 1g IV Q8H.   Temp (24hrs), Avg:98.6 F (37 C), Min:98 F (36.7 C), Max:99.1 F (37.3 C)   Recent Labs Lab 05/18/16 0100  WBC 12.7*  CREATININE 0.76    CrCl cannot be calculated (Unknown ideal weight.).    Allergies  Allergen Reactions  . Erythromycin Other (See Comments)    blisters  . Penicillins Other (See Comments)    blisters  . Statins Itching    Face red  . Sulfa Antibiotics Rash    itching     Thank you for allowing pharmacy to be a part of this patient's care.  Vernard Gambles, PharmD, BCPS  05/18/2016 4:10 AM

## 2016-05-18 NOTE — ED Provider Notes (Signed)
CSN: [id number]     Arrival date & time 05/17/16  1933 History  By signing my name below, I, Katelyn Cook, attest that this documentation has been prepared under the direction and in the presence of Zadie Rhine, MD. Electronically Signed: Bethel Cook, ED Scribe. 05/18/2016. 3:37 AM    Chief Complaint  Patient presents with  . Tremors    Patient is a 55 y.o. female presenting with extremity pain. The history is provided by the patient. No language interpreter was used.  Extremity Pain This is a chronic problem. The current episode started more than 2 days ago. The problem occurs constantly. The problem has been gradually worsening. Pertinent negatives include no chest pain, no abdominal pain, no headaches and no shortness of breath. Nothing aggravates the symptoms. Nothing relieves the symptoms. She has tried nothing for the symptoms.   Katelyn Cook is a 55 y.o. female with PMHx of HTN, HLD, anxiety, rheumatoid arthritis, and chronic back pain who presents to the Emergency Department complaining of increased, constant, 10/10 in severity, joint pain and tremors with onset last week. She associates her symptoms with her living conditions stating that her house is cold and without running water. Associated symptoms include chills and sweating. Pt states that she fell last week and "cracked" her left wrist. She is no longer able to afford her Orencia and states that her health has declined since. Pt states that last week she was admitted to [facility] for a staph infection and dehydration, since then she reports having N/V/D. Pt denies new cough but notes that she has COPD. She denies fever.   Past Medical History  Diagnosis Date  . Hypertension     takes Losartan daily  . Hyperlipidemia     but doesn't take any meds at this time  . Bronchitis     hx of-around 2006  . Neck pain     herniated disc  . Joint pain   . Joint swelling   . Chronic back pain      radiculopathy,lumbago/stenosis/herniated disc  . Bruises easily     pt states d/t being on Prednisone since 2006  . Multiple bruises     on both arms  . Chronic constipation     takes Amitiza prn  . Hemorrhoids   . Urinary incontinence   . Anxiety     takes Citalopram daily  . Staph infection     hx of in 1997  . Wears dentures     top  . Wears partial dentures     lower partial  . Wears glasses   . Pneumonia     hx of in 1992  . Asthma     hx bronchitis-none now  . Rheumatoid arthritis(714.0)     takes Orencia monthly   Past Surgical History  Procedure Laterality Date  . Tonsillectomy      as a child  . Rectal prolapse repair  1981  . Foot surgery  2010    right foot with screws placed   . Foot surgery      bone fusion 2012  . Abdominal hysterectomy  1984  . Colonoscopy    . Esophagogastrectomy    . Diagnostic laparoscopy  early 52's  . Appendectomy    . Back surgery  4/13    lumb fusion  . Tarsal metatarsal arthrodesis Right 01/28/2013    Procedure: TARSAL METATARSAL FUSION;  Surgeon: Sherri Rad, MD;  Location: [facility];  Service: Orthopedics;  Laterality: Right;  RIGHT GREAT TOE MTP FUSION REVISION WITH DORSAL PLATE, LOCAL BONE GRAFT   . Hardware removal Right 01/28/2013    Procedure: HARDWARE REMOVAL;  Surgeon: Sherri Rad, MD;  Location: [facility];  Service: Orthopedics;  Laterality: Right;  HARDWARE REMOVAL DEEP LEFT FOOT (GREAT TOE ONLY)   Family History  Problem Relation Age of Onset  . Anesthesia problems Neg Hx   . Hypotension Neg Hx   . Malignant hyperthermia Neg Hx   . Pseudochol deficiency Neg Hx   . Hyperlipidemia Neg Hx   . Heart attack Neg Hx   . Diabetes Neg Hx   . Hypertension Father    Social History  Substance Use Topics  . Smoking status: Current Every Day Smoker -- 1.00 packs/day for 35 years  . Smokeless tobacco: None  . Alcohol Use: No   OB History    No data available     Review of  Systems  Constitutional: Positive for chills and diaphoresis. Negative for fever.  Respiratory: Negative for shortness of breath.   Cardiovascular: Negative for chest pain.  Gastrointestinal: Positive for nausea, vomiting and diarrhea. Negative for abdominal pain.  Musculoskeletal: Positive for arthralgias.  Neurological: Positive for tremors. Negative for headaches.  All other systems reviewed and are negative.  Allergies  Erythromycin; Penicillins; Statins; and Sulfa antibiotics  Home Medications   Prior to Admission medications   Medication Sig Start Date End Date Taking? Authorizing Provider  Abatacept (ORENCIA) 125 MG/ML SOLN Inject into the skin. 1 injection each month    Historical Provider, MD  albuterol (PROVENTIL HFA;VENTOLIN HFA) 108 (90 BASE) MCG/ACT inhaler Inhale 2 puffs into the lungs every 6 (six) hours as needed for wheezing.    Historical Provider, MD  aspirin EC 325 MG tablet Take 1 tablet (325 mg total) by mouth 2 (two) times daily. 01/28/13   Kirtland Bouchard, PA-C  B Complex Vitamins (VITAMIN B-COMPLEX 100) INJ Inject as directed. 1 injection each month    Historical Provider, MD  citalopram (CELEXA) 10 MG tablet Take 10 mg by mouth daily.    Historical Provider, MD  diazepam (VALIUM) 5 MG tablet  07/02/13   Historical Provider, MD  estradiol (ESTRACE) 2 MG tablet Take 2 mg by mouth daily.    Historical Provider, MD  gabapentin (NEURONTIN) 300 MG capsule Take 300 mg by mouth 5 (five) times daily.     Historical Provider, MD  HYDROcodone-acetaminophen (NORCO/VICODIN) 5-325 MG per tablet  09/07/13   Historical Provider, MD  losartan (COZAAR) 50 MG tablet Take 50 mg by mouth daily.    Historical Provider, MD  meloxicam (MOBIC) 7.5 MG tablet Take 7.5 mg by mouth daily.    Historical Provider, MD  predniSONE (STERAPRED UNI-PAK) 10 MG tablet 6 tabs po daily x 5 days, 5 tabs po daily x 5 days, 4 tabs po daily x 5 days, 3 tabs po daily x 5 days, then 2 tabs po daily x 5 days.  07/17/13   Lenda Kelp, MD  vitamin C (ASCORBIC ACID) 500 MG tablet Take 1 tablet (500 mg total) by mouth daily. 01/28/13   Kirtland Bouchard, PA-C  zoledronic acid (RECLAST) 5 MG/100ML SOLN Inject 5 mg into the vein once.    Historical Provider, MD   BP 141/80 mmHg  Pulse 78  Temp(Src) 98 F (36.7 C) (Oral)  Resp 20  SpO2 100% Physical Exam CONSTITUTIONAL: Elderly and frail  HEAD: Normocephalic/atraumatic EYES: EOMI/PERRL ENMT: Mucous membranes moist  NECK: supple no meningeal signs SPINE/BACK:entire spine nontender CV: S1/S2 noted, no murmurs/rubs/gallops noted LUNGS: Lungs are clear to auscultation bilaterally, no apparent distress ABDOMEN: soft, nontender, no rebound or guarding, bowel sounds noted throughout abdomen GU:no cva tenderness NEURO: Pt is awake/alert/appropriate, moves all extremitiesx4.  No facial droop. No tremor noted  EXTREMITIES: pulses normal/equal, full ROM, tenderness to left wrist, All other extremities/joints palpated/ranged and nontender SKIN: Dry skin, evidence of areas of bruising to both upper extremities  PSYCH: no abnormalities of mood noted, alert and oriented to situation  ED Course  Procedures  SPLINT APPLICATION Date/Time: 0300am Authorized by: Joya Gaskins Consent: Verbal consent obtained. Risks and benefits: risks, benefits and alternatives were discussed Consent given by: patient Splint applied by: orthopedic technician Location details: left wrist Splint type: thumb spica Supplies used: ortho glass Post-procedure: The splinted body part was neurovascularly unchanged following the procedure. Patient tolerance: Patient tolerated the procedure well with no immediate complications.     DIAGNOSTIC STUDIES: Oxygen Saturation is 100% on RA,  normal by my interpretation.    COORDINATION OF CARE: 12:48 AM Discussed treatment plan which includes lab work, left wrist XR, and splinting with pt at bedside and pt agreed to plan.  3:36  AM-Consult complete with Dr. Clyde Lundborg (Hospitalist). Patient case explained and discussed. Agrees to admit patient for further evaluation and treatment. Call ended at 3:37 AM Will admit for hypokalemia  Pt agreeable with plan  Labs Review Labs Reviewed  BASIC METABOLIC PANEL - Abnormal; Notable for the following:    Potassium 2.3 (*)    Chloride 99 (*)    BUN <5 (*)    Calcium 8.7 (*)    All other components within normal limits  CBC WITH DIFFERENTIAL/PLATELET - Abnormal; Notable for the following:    WBC 12.7 (*)    RBC 3.84 (*)    Hemoglobin 10.9 (*)    HCT 35.2 (*)    RDW 17.4 (*)    Platelets 473 (*)    Lymphs Abs 5.3 (*)    Monocytes Absolute 1.7 (*)    All other components within normal limits  URINALYSIS, ROUTINE W REFLEX MICROSCOPIC (NOT AT [facility]) - Abnormal; Notable for the following:    Specific Gravity, Urine 1.004 (*)    Leukocytes, UA MODERATE (*)    All other components within normal limits  URINE MICROSCOPIC-ADD ON - Abnormal; Notable for the following:    Squamous Epithelial / LPF 0-5 (*)    Bacteria, UA RARE (*)    All other components within normal limits  C DIFFICILE QUICK SCREEN W PCR REFLEX  GASTROINTESTINAL PANEL BY PCR, STOOL (REPLACES STOOL CULTURE)  URINE CULTURE  CULTURE, BLOOD (ROUTINE X 2)  CULTURE, BLOOD (ROUTINE X 2)  MAGNESIUM  LACTIC ACID, PLASMA  LACTIC ACID, PLASMA  PROCALCITONIN  PROTIME-INR  APTT  BASIC METABOLIC PANEL  CBC    Imaging Review Dg Wrist Complete Left  05/17/2016  CLINICAL DATA:  Status post fall, with posterior left wrist pain and swelling. Initial encounter. EXAM: LEFT WRIST - COMPLETE 3+ VIEW COMPARISON:  None. FINDINGS: There is a comminuted fracture through the distal pole of the scaphoid, with mildly displaced fragments. The remaining carpal bones are grossly unremarkable. The joint spaces are preserved. Surrounding soft tissue swelling is noted at the wrist. IMPRESSION: Comminuted fracture through the distal pole of the  scaphoid, with mildly displaced fragments. Electronically Signed   By: Roanna Raider M.D.   On: 05/17/2016 20:57   I have personally  reviewed and evaluated these images and lab results as part of my medical decision-making.   EKG Interpretation   Date/Time:  Friday May 18 2016 01:21:13 EDT Ventricular Rate:  65 PR Interval:  171 QRS Duration: 106 QT Interval:  452 QTC Calculation: 470 R Axis:   49 Text Interpretation:  Sinus rhythm artifact noted Non-specific ST-t  changes No significant change since last tracing Confirmed by Bebe Shaggy   MD, Dorinda Hill ([id number]) on 05/18/2016 1:25:09 AM      MDM   Final diagnoses:  Hypokalemia  Scaphoid fracture of wrist, left, closed, initial encounter  Tobacco abuse    Nursing notes including past medical history and social history reviewed and considered in documentation Labs/vital reviewed myself and considered during evaluation   I personally performed the services described in this documentation, which was scribed in my presence. The recorded information has been reviewed and is accurate.       Zadie Rhine, MD 05/18/16 [phone]

## 2016-05-18 NOTE — ED Notes (Signed)
Ortho tech at bedside 

## 2016-05-18 NOTE — H&P (Signed)
History and Physical    Katelyn Cook IDP:[id number] DOB: 01/11/1961 DOA: 05/18/2016  Referring MD/NP/PA:   PCP: Raynelle Jan., MD   Patient coming from:  The patient is coming from home.  At baseline, pt is partially dependent for ADL.   Chief Complaint: Increased urinary frequency, nausea, vomiting, diarrhea, generalized weakness, pain all over, fall, left wrist pain.  HPI: Katelyn Cook is a 55 y.o. female with medical history significant of rheumatoid arthritis, hypertension, hyperlipidemia, asthma, tobacco abuse, depression, anxiety, chronic back pain, who presents with increased urinary frequency, nausea, vomiting, diarrhea, generalized weakness, pain all over, fall and left wrist pain.  Patient reports that she has been having nausea, vomiting, diarrhea for more a week. She vomited 1 to 2 times each day without blood in the vomitus. She has 2 or 3 bowel movements each day with loose stool. Patient does not have abdominal pain. Patient states that she used antibiotics recently, but she cannot provide a detailed information. She also has increased urinary frequency recently, but no dysuria or burning on urination. She states that she used to take Orencia for rheumatoid arthritis with well control of her joint pain, but she stopped taking his medication 2015 since she could not afford it anymore. She states that she has pain all over, including bilateral hip, lower back, bilateral hands and feet. She feels very weak recently. She states that she had a fall last week due to weakness, and injured her left wrist. She has severe pain over left wrist, 10 out of 10, constant, nonradiating. It is aggravated by movement. Patient does not have unilateral weakness, vision change or hearing loss. No chest pain, shortness of breath, cough. Patient has mild subjective fever, but no chills.  ED Course: pt was found to have WBC 12.7, positive urinalysis for UTI, temperature 99.1, present a tachycardia, transient  tachypnea, potassium 2.3, creatinine 0.76. X-ray of her left wrist showed comminuted fracture through the distal pole of the scaphoid, with mildly displaced fragments. Pt is placed on tele bed for obs.  Review of Systems:   General: has subjetive fevers, no chills, no changes in body weight, has poor appetite, has fatigue HEENT: no blurry vision, hearing changes or sore throat Pulm: no dyspnea, coughing, wheezing CV: no chest pain, no palpitations Abd: has nausea, vomiting, diarrhea, no abdominal pain, constipation GU: no dysuria, burning on urination, has increased urinary frequency, no  hematuria  Ext: no leg edema Neuro: no unilateral weakness, numbness, or tingling, no vision change or hearing loss Skin: no rash. Has multiple bruises over her arms. MSK: has left wrist pain. Heme: No easy bruising.  Travel history: No recent long distant travel.  Allergy:  Allergies  Allergen Reactions  . Erythromycin Other (See Comments)    blisters  . Penicillins Other (See Comments)    blisters  . Statins Itching    Face red  . Sulfa Antibiotics Rash    itching    Past Medical History  Diagnosis Date  . Hypertension     takes Losartan daily  . Hyperlipidemia     but doesn't take any meds at this time  . Bronchitis     hx of-around 2006  . Neck pain     herniated disc  . Joint pain   . Joint swelling   . Chronic back pain     radiculopathy,lumbago/stenosis/herniated disc  . Bruises easily     pt states d/t being on Prednisone since 2006  . Multiple bruises  on both arms  . Chronic constipation     takes Amitiza prn  . Hemorrhoids   . Urinary incontinence   . Anxiety     takes Citalopram daily  . Staph infection     hx of in 1997  . Wears dentures     top  . Wears partial dentures     lower partial  . Wears glasses   . Pneumonia     hx of in 1992  . Asthma     hx bronchitis-none now  . Rheumatoid arthritis(714.0)     takes Orencia monthly  . Tobacco abuse      Past Surgical History  Procedure Laterality Date  . Tonsillectomy      as a child  . Rectal prolapse repair  1981  . Foot surgery  2010    right foot with screws placed   . Foot surgery      bone fusion 2012  . Abdominal hysterectomy  1984  . Colonoscopy    . Esophagogastrectomy    . Diagnostic laparoscopy  early 47's  . Appendectomy    . Back surgery  4/13    lumb fusion  . Tarsal metatarsal arthrodesis Right 01/28/2013    Procedure: TARSAL METATARSAL FUSION;  Surgeon: Sherri Rad, MD;  Location: [facility];  Service: Orthopedics;  Laterality: Right;  RIGHT GREAT TOE MTP FUSION REVISION WITH DORSAL PLATE, LOCAL BONE GRAFT   . Hardware removal Right 01/28/2013    Procedure: HARDWARE REMOVAL;  Surgeon: Sherri Rad, MD;  Location: [facility];  Service: Orthopedics;  Laterality: Right;  HARDWARE REMOVAL DEEP LEFT FOOT (GREAT TOE ONLY)    Social History:  reports that she has been smoking.  She does not have any smokeless tobacco history on file. She reports that she does not drink alcohol or use illicit drugs.  Family History:  Family History  Problem Relation Age of Onset  . Anesthesia problems Neg Hx   . Hypotension Neg Hx   . Malignant hyperthermia Neg Hx   . Pseudochol deficiency Neg Hx   . Hyperlipidemia Neg Hx   . Heart attack Neg Hx   . Diabetes Neg Hx   . Hypertension Father      Prior to Admission medications   Medication Sig Start Date End Date Taking? Authorizing Provider  Abatacept (ORENCIA) 125 MG/ML SOLN Inject into the skin. 1 injection each month    Historical Provider, MD  albuterol (PROVENTIL HFA;VENTOLIN HFA) 108 (90 BASE) MCG/ACT inhaler Inhale 2 puffs into the lungs every 6 (six) hours as needed for wheezing.    Historical Provider, MD  aspirin EC 325 MG tablet Take 1 tablet (325 mg total) by mouth 2 (two) times daily. 01/28/13   Kirtland Bouchard, PA-C  B Complex Vitamins (VITAMIN B-COMPLEX 100) INJ Inject as directed.  1 injection each month    Historical Provider, MD  citalopram (CELEXA) 10 MG tablet Take 10 mg by mouth daily.    Historical Provider, MD  diazepam (VALIUM) 5 MG tablet  07/02/13   Historical Provider, MD  estradiol (ESTRACE) 2 MG tablet Take 2 mg by mouth daily.    Historical Provider, MD  gabapentin (NEURONTIN) 300 MG capsule Take 300 mg by mouth 5 (five) times daily.     Historical Provider, MD  HYDROcodone-acetaminophen (NORCO/VICODIN) 5-325 MG per tablet  09/07/13   Historical Provider, MD  losartan (COZAAR) 50 MG tablet Take 50 mg by mouth daily.  Historical Provider, MD  meloxicam (MOBIC) 7.5 MG tablet Take 7.5 mg by mouth daily.    Historical Provider, MD  predniSONE (STERAPRED UNI-PAK) 10 MG tablet 6 tabs po daily x 5 days, 5 tabs po daily x 5 days, 4 tabs po daily x 5 days, 3 tabs po daily x 5 days, then 2 tabs po daily x 5 days. 07/17/13   Lenda Kelp, MD  vitamin C (ASCORBIC ACID) 500 MG tablet Take 1 tablet (500 mg total) by mouth daily. 01/28/13   Kirtland Bouchard, PA-C  zoledronic acid (RECLAST) 5 MG/100ML SOLN Inject 5 mg into the vein once.    Historical Provider, MD    Physical Exam: Filed Vitals:   05/18/16 0230 05/18/16 0245 05/18/16 0300 05/18/16 0315  BP: 122/83 109/87 141/87 134/97  Pulse: 65 64 73 69  Temp:      TempSrc:      Resp: 14 15 15 15   SpO2: 94% 95% 90% 95%   General: Not in acute distress. Cachectic HEENT:       Eyes: PERRL, EOMI, no scleral icterus.       ENT: No discharge from the ears and nose, no pharynx injection, no tonsillar enlargement.        Neck: No JVD, no bruit, no mass felt. Heme: No neck lymph node enlargement. Cardiac: S1/S2, RRR, No murmurs, No gallops or rubs. Pulm:  No rales, wheezing, rhonchi or rubs. Abd: Soft, nondistended, nontender, no rebound pain, no organomegaly, BS present. GU: No hematuria Ext: No pitting leg edema bilaterally. 2+DP/PT pulse bilaterally. Musculoskeletal: has left wrist tenderness. Skin: No rashes. Has  multiple bruises over both arms. Neuro: Alert, oriented X3, cranial nerves II-XII grossly intact, moves all extremities normally. Psych: Patient is not psychotic, no suicidal or hemocidal ideation.  Labs on Admission: I have personally reviewed following labs and imaging studies  CBC:  Recent Labs Lab 05/18/16 0100  WBC 12.7*  NEUTROABS 5.6  HGB 10.9*  HCT 35.2*  MCV 91.7  PLT 473*   Basic Metabolic Panel:  Recent Labs Lab 05/18/16 0100 05/18/16 0234  NA 139  --   K 2.3*  --   CL 99*  --   CO2 30  --   GLUCOSE 89  --   BUN <5*  --   CREATININE 0.76  --   CALCIUM 8.7*  --   MG  --  1.8   GFR: CrCl cannot be calculated (Unknown ideal weight.). Liver Function Tests: No results for input(s): AST, ALT, ALKPHOS, BILITOT, PROT, ALBUMIN in the last 168 hours. No results for input(s): LIPASE, AMYLASE in the last 168 hours. No results for input(s): AMMONIA in the last 168 hours. Coagulation Profile: No results for input(s): INR, PROTIME in the last 168 hours. Cardiac Enzymes: No results for input(s): CKTOTAL, CKMB, CKMBINDEX, TROPONINI in the last 168 hours. BNP (last 3 results) No results for input(s): PROBNP in the last 8760 hours. HbA1C: No results for input(s): HGBA1C in the last 72 hours. CBG: No results for input(s): GLUCAP in the last 168 hours. Lipid Profile: No results for input(s): CHOL, HDL, LDLCALC, TRIG, CHOLHDL, LDLDIRECT in the last 72 hours. Thyroid Function Tests: No results for input(s): TSH, T4TOTAL, FREET4, T3FREE, THYROIDAB in the last 72 hours. Anemia Panel: No results for input(s): VITAMINB12, FOLATE, FERRITIN, TIBC, IRON, RETICCTPCT in the last 72 hours. Urine analysis:    Component Value Date/Time   COLORURINE YELLOW 05/18/2016 0135   APPEARANCEUR CLEAR 05/18/2016 0135  LABSPEC 1.004* 05/18/2016 0135   PHURINE 6.5 05/18/2016 0135   GLUCOSEU NEGATIVE 05/18/2016 0135   HGBUR NEGATIVE 05/18/2016 0135   BILIRUBINUR NEGATIVE 05/18/2016 0135     KETONESUR NEGATIVE 05/18/2016 0135   PROTEINUR NEGATIVE 05/18/2016 0135   UROBILINOGEN 0.2 04/02/2012 1418   NITRITE NEGATIVE 05/18/2016 0135   LEUKOCYTESUR MODERATE* 05/18/2016 0135   Sepsis Labs: @LABRCNTIP (procalcitonin:4,lacticidven:4) )No results found for this or any previous visit (from the past 240 hour(s)).   Radiological Exams on Admission: Dg Wrist Complete Left  05/17/2016  CLINICAL DATA:  Status post fall, with posterior left wrist pain and swelling. Initial encounter. EXAM: LEFT WRIST - COMPLETE 3+ VIEW COMPARISON:  None. FINDINGS: There is a comminuted fracture through the distal pole of the scaphoid, with mildly displaced fragments. The remaining carpal bones are grossly unremarkable. The joint spaces are preserved. Surrounding soft tissue swelling is noted at the wrist. IMPRESSION: Comminuted fracture through the distal pole of the scaphoid, with mildly displaced fragments. Electronically Signed   By: 07/17/2016 M.D.   On: 05/17/2016 20:57     EKG: Independently reviewed. Sinus rhythm, QTC 470, no ischemic change.  Assessment/Plan Principal Problem:   UTI (lower urinary tract infection) Active Problems:   Hypertension   Anxiety   Asthma   Rheumatoid arthritis (HCC)   Closed fracture of left wrist   Hypokalemia   Nausea vomiting and diarrhea   Tobacco abuse   Protein-calorie malnutrition, severe (HCC)  UTI (lower urinary tract infection): Patient has increased urinary frequency and positive urinalysis, consistent with UTI, which may have contributed to his generalized weakness. -will place on tele bed -start aztreonam IV -f/u Bx and Ux -will get Procalcitonin and trend lactic acid levels -IVF: 1.5L of NS bolus in ED, followed by 75 cc/h   Nausea vomiting and diarrhea: Etiology is not clear. Left ear due to viral gastroenteritis. Patient states that she used antibiotics recently, but she cannot provide the detailed information. Will need to r/o c  diff -will check c diff pcr and Gi path panel -When necessary Zofran for nausea -IV fluid as above -start pepcid  Hypokalemia: K= 2.3 and Mg=1.8 on admission. - Repleted with total of 90 mEq of KCl - give 2 g of magnesium sulfate  HTN: -continue losartan  Rheumatoid arthritis: used to be well controled with Orencia, unfortunately patient could not afford it anymore. Now presents with diffused joint pain. -will treat with prednisone 10 mg daily x 3 days -continue home mobic  Depression and anxiety: Stable, no suicidal or homicidal ideations. -Continue home medications: Valium, Celexa  Asthma: stable. -When necessary albuterol nebulizer    Closed fracture of left wrist: X-ray of her left wrist showed comminuted fracture through the distal pole of the scaphoid, with mildly displaced fragments.Since it has been going on for a week, no urgent need to consult ortho in night.  -apply splint -prn percocet for pain -please discuss with ortho in AM.  Protein-calorie malnutrition, severe (HCC): -Ensure -consult to nutrition team  Tobacco abuse: -Did counseling about importance of quitting smoking -Nicotine patch  DVT ppx: SQ Lovenox Code Status: Full code Family Communication: Yes, patient's husband at bed side Disposition Plan:  Anticipate discharge back to previous home environment Consults called:  none Admission status: Obs / tele   Date of Service 05/18/2016    07/18/2016 [facility] Pager [phone]  If 7PM-7AM, please contact night-coverage [email or web address] Password [email or web address] 05/18/2016, 4:21 AM

## 2016-05-18 NOTE — ED Notes (Signed)
Pt ambulated to bathroom with stand by assistance; approx 15 steps. Pt had difficulty maintaining balance, kept stumbling throughout walk, and c/o feeling weak.On way back to bathroom, pt was able to walk approx 5 steps without assistance, but then stumbled again and kept looking down at the ground while walking; pt rarely kept eyes open.

## 2016-05-18 NOTE — Progress Notes (Signed)
Pt's husband brought back to the unit by rapid response nurses after he checked himself in the ED and discharged himself against medical advise. The gentleman was located around the loading bays after having a fall in that area. Rapid response nurses were called to the scene and they brought him back to the unit. Advised to seek medical attention but he declined using profane language. His wife, who is a patient on this unit, however is in stable condition

## 2016-05-18 NOTE — Evaluation (Addendum)
Physical Therapy Evaluation Patient Details Name: Katelyn Cook MRN: [id number] DOB: 11/05/1961 Today's Date: 05/18/2016   History of Present Illness  55 y.o. female with medical history significant of rheumatoid arthritis, hypertension, hyperlipidemia, asthma, tobacco abuse, depression, anxiety, chronic back pain, who presents with increased urinary frequency, nausea, vomiting, diarrhea, generalized weakness, pain all over, fall and left wrist pain.  Clinical Impression  Pt seen for initial evaluation and treatment. At this time the patient is moving slowly and is limiting her ambulation to 12 feet due to pain. Gait stability may improve with use of cane. Per patient's report, she is near her baseline which has been very limited over the last 2 weeks. At home she is the primary caregiver for her husband who will not be able to assist her when she goes back home. PT to continue to follow and work to progress her mobility to assist with her transition back home.   Follow Up Recommendations Home health PT;Supervision for mobility/OOB (pt reports that her home may not be safe for HHPT services to be provided)    Equipment Recommendations  Cane    Recommendations for Other Services OT consult     Precautions / Restrictions Precautions Precautions: Fall Precaution Comments: Lt wrist fx Restrictions Weight Bearing Restrictions: No      Mobility  Bed Mobility Overal bed mobility: Modified Independent       Supine to sit: Modified independent (Device/Increase time) Sit to supine: Modified independent (Device/Increase time)   General bed mobility comments: HOB elevated and using rail to assist.   Transfers Overall transfer level: Needs assistance Equipment used: None Transfers: Sit to/from Stand Sit to Stand: Min guard         General transfer comment: mild instability but no gross loss of balance  Ambulation/Gait Ambulation/Gait assistance: Min guard Ambulation Distance (Feet):  12 Feet Assistive device: None Gait Pattern/deviations: Step-through pattern;Decreased step length - right;Decreased step length - left Gait velocity: very slow pattern   General Gait Details: Pt taking small steps and maintaining a flexed trunk posture. She reports that this is close to her baseline for walking. Pt reports having too much pain to ambulate any further.   Stairs            Wheelchair Mobility    Modified Rankin (Stroke Patients Only)       Balance Overall balance assessment: Needs assistance Sitting-balance support: No upper extremity supported Sitting balance-Leahy Scale: Good     Standing balance support: No upper extremity supported Standing balance-Leahy Scale: Fair Standing balance comment: mild instability but no gross loss of balance                             Pertinent Vitals/Pain Pain Assessment: 0-10 Pain Score: 8  Pain Location: all over Pain Descriptors / Indicators: Aching;Sore Pain Intervention(s): Limited activity within patient's tolerance;Monitored during session    Home Living Family/patient expects to be discharged to:: Private residence Living Arrangements: Spouse/significant other   Type of Home: House Home Access: Stairs to enter Entrance Stairs-Rails: Right (reports unstable rail, not sure if she can use it. ) Entrance Stairs-Number of Steps: 4-5 Home Layout: One level Home Equipment: None Additional Comments: Pt reports that 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 She states that he is unable to assist her at home.     Prior Function Level of Independence: Independent  Comments: denies using any assistive device.     Hand Dominance        Extremity/Trunk Assessment   Upper Extremity Assessment: Generalized weakness           Lower Extremity Assessment: Generalized weakness      Cervical / Trunk Assessment: Kyphotic  Communication   Communication:  No difficulties  Cognition Arousal/Alertness: Awake/alert Behavior During Therapy: WFL for tasks assessed/performed Overall Cognitive Status: Within Functional Limits for tasks assessed                      General Comments      Exercises        Assessment/Plan    PT Assessment Patient needs continued PT services  PT Diagnosis Difficulty walking;Generalized weakness   PT Problem List Decreased strength;Decreased range of motion;Decreased balance;Decreased activity tolerance;Decreased mobility  PT Treatment Interventions DME instruction;Gait training;Stair training;Functional mobility training;Therapeutic activities;Therapeutic exercise;Balance training;Patient/family education   PT Goals (Current goals can be found in the Care Plan section) Acute Rehab PT Goals Patient Stated Goal: move without pain PT Goal Formulation: With patient Time For Goal Achievement: 06/01/16 Potential to Achieve Goals: Good    Frequency Min 3X/week   Barriers to discharge Decreased caregiver support      Co-evaluation               End of Session Equipment Utilized During Treatment: Gait belt Activity Tolerance: Patient limited by pain Patient left: in bed;with call bell/phone within reach Nurse Communication: Mobility status    Functional Assessment Tool Used: clinical judgment Functional Limitation: Mobility: Walking and moving around Mobility: Walking and Moving Around Current Status (W4462): At least 20 percent but less than 40 percent impaired, limited or restricted Mobility: Walking and Moving Around Goal Status [phone]): At least 1 percent but less than 20 percent impaired, limited or restricted    Time: 1311-1338 PT Time Calculation (min) (ACUTE ONLY): 27 min   Charges:   PT Evaluation $PT Eval Moderate Complexity: 1 Procedure PT Treatments $Therapeutic Activity: 8-22 mins   PT G Codes:   PT G-Codes **NOT FOR INPATIENT CLASS** Functional Assessment Tool Used:  clinical judgment Functional Limitation: Mobility: Walking and moving around Mobility: Walking and Moving Around Current Status (R7116): At least 20 percent but less than 40 percent impaired, limited or restricted Mobility: Walking and Moving Around Goal Status [phone]): At least 1 percent but less than 20 percent impaired, limited or restricted    Christiane Ha, PT, CSCS Pager [phone] Office [phone]  05/18/2016, 2:03 PM

## 2016-05-18 NOTE — Progress Notes (Signed)
Initial Nutrition Assessment  DOCUMENTATION CODES:   Underweight, Severe malnutrition in context of chronic illness  INTERVENTION:   -Increase Ensure Enlive po to TID, each supplement provides 350 kcal and 20 grams of protein  NUTRITION DIAGNOSIS:   Malnutrition related to chronic illness as evidenced by severe depletion of body fat, severe depletion of muscle mass.  GOAL:   Patient will meet greater than or equal to 90% of their needs  MONITOR:   PO intake, Supplement acceptance, Labs, Weight trends, Skin, I & O's  REASON FOR ASSESSMENT:   Consult, Malnutrition Screening Tool Assessment of nutrition requirement/status  ASSESSMENT:   Katelyn Cook is a 55 y.o. female with medical history significant of rheumatoid arthritis, hypertension, hyperlipidemia, asthma, tobacco abuse, depression, anxiety, chronic back pain, who presents with increased urinary frequency, nausea, vomiting, diarrhea, generalized weakness, pain all over, fall and left wrist pain.  Pt admitted with UTI.   Hx obtained from pt at bedside. She reports a general decline in health over the past 5 years, but more significantly over the past 2 years since discontinuing RA treatments due to expense. She reports UBW around 185#, but weighed around 130# just prior to discontinuing RA treatments 2 years ago. Reviewed Care Everywhere [facility] and [facility], noted wt of 115# in 04/27/15 (7.8% wt loss > 1 year ago).   Pt reports she has a good appetite. Noted meal completion 70-85%. Pt consumed grits and eggs for breakfast, per her report. Lunch tray at bedside revealed pt consumed 100% of her chicken breast and cake. Pt disclosed to this RD that intake has been poor secondary to food insecurity (pt husband receives only $21 in food stamps per month). Pt expresses knowledge about a balanced diet and importance of incorporating protein sources into her diet, however, reports "sometimes we just live off toast".   RD actively  listened to pt concerns; she expressed stress over poor social situation including food insecurity, unable to afford RA medications, and being the primary caretaker for her husband with cancer. She shares that she has no support in term of friends or family nearby.   Nutrition-Focused physical exam completed. Findings are moderate to severe fat depletion, moderate to severe muscle depletion, and no edema.   Discussed importance of good meal intake to promote healing. Discussed way to could incorporate more protein in diet, particularly affordable sources (ex dry beans and peanut butter). Pt would like to continue Ensure; prefers strawberry flavor.   Case discussed with RN.   Labs reviewed: K: 2.9.   Diet Order:  Diet Heart Room service appropriate?: Yes; Fluid consistency:: Thin  Skin:  Reviewed, no issues  Last BM:  05/17/16  Height:   Ht Readings from Last 1 Encounters:  05/18/16 5\' 7"  (1.702 m)    Weight:   Wt Readings from Last 1 Encounters:  05/18/16 106 lb 0.7 oz (48.1 kg)    Ideal Body Weight:  61.4 kg  BMI:  Body mass index is 16.6 kg/(m^2).  Estimated Nutritional Needs:   Kcal:  1400-1600  Protein:  55-70 grams  Fluid:  1.4-1.6 L  EDUCATION NEEDS:   Education needs addressed  Vasily Fedewa A. 07/18/16, RD, LDN, CDE Pager: [phone] After hours Pager: [phone]

## 2016-05-19 DIAGNOSIS — I10 Essential (primary) hypertension: Secondary | ICD-10-CM

## 2016-05-19 DIAGNOSIS — F419 Anxiety disorder, unspecified: Secondary | ICD-10-CM

## 2016-05-19 DIAGNOSIS — Z720 Tobacco use: Secondary | ICD-10-CM

## 2016-05-19 DIAGNOSIS — J4520 Mild intermittent asthma, uncomplicated: Secondary | ICD-10-CM | POA: Diagnosis not present

## 2016-05-19 DIAGNOSIS — R112 Nausea with vomiting, unspecified: Secondary | ICD-10-CM

## 2016-05-19 DIAGNOSIS — E43 Unspecified severe protein-calorie malnutrition: Secondary | ICD-10-CM

## 2016-05-19 DIAGNOSIS — N390 Urinary tract infection, site not specified: Secondary | ICD-10-CM | POA: Diagnosis not present

## 2016-05-19 DIAGNOSIS — S62102A Fracture of unspecified carpal bone, left wrist, initial encounter for closed fracture: Secondary | ICD-10-CM

## 2016-05-19 DIAGNOSIS — M069 Rheumatoid arthritis, unspecified: Secondary | ICD-10-CM

## 2016-05-19 DIAGNOSIS — R197 Diarrhea, unspecified: Secondary | ICD-10-CM

## 2016-05-19 DIAGNOSIS — E876 Hypokalemia: Secondary | ICD-10-CM

## 2016-05-19 LAB — CBC
HEMATOCRIT: 34.4 % — AB (ref 36.0–46.0)
HEMOGLOBIN: 10.1 g/dL — AB (ref 12.0–15.0)
MCH: 28.1 pg (ref 26.0–34.0)
MCHC: 29.4 g/dL — ABNORMAL LOW (ref 30.0–36.0)
MCV: 95.6 fL (ref 78.0–100.0)
Platelets: 398 10*3/uL (ref 150–400)
RBC: 3.6 MIL/uL — AB (ref 3.87–5.11)
RDW: 17.4 % — ABNORMAL HIGH (ref 11.5–15.5)
WBC: 10.9 10*3/uL — ABNORMAL HIGH (ref 4.0–10.5)

## 2016-05-19 LAB — URINE CULTURE

## 2016-05-19 LAB — BASIC METABOLIC PANEL
ANION GAP: 7 (ref 5–15)
BUN: 6 mg/dL (ref 6–20)
CHLORIDE: 104 mmol/L (ref 101–111)
CO2: 30 mmol/L (ref 22–32)
Calcium: 8.4 mg/dL — ABNORMAL LOW (ref 8.9–10.3)
Creatinine, Ser: 0.67 mg/dL (ref 0.44–1.00)
Glucose, Bld: 87 mg/dL (ref 65–99)
POTASSIUM: 2.8 mmol/L — AB (ref 3.5–5.1)
SODIUM: 141 mmol/L (ref 135–145)

## 2016-05-19 LAB — MAGNESIUM: MAGNESIUM: 1.9 mg/dL (ref 1.7–2.4)

## 2016-05-19 MED ORDER — POTASSIUM CHLORIDE CRYS ER 20 MEQ PO TBCR
40.0000 meq | EXTENDED_RELEASE_TABLET | Freq: Two times a day (BID) | ORAL | Status: AC
  Administered 2016-05-19 (×2): 40 meq via ORAL
  Filled 2016-05-19 (×2): qty 2

## 2016-05-19 MED ORDER — FOSFOMYCIN TROMETHAMINE 3 G PO PACK
3.0000 g | PACK | Freq: Once | ORAL | Status: AC
  Administered 2016-05-19: 3 g via ORAL
  Filled 2016-05-19: qty 3

## 2016-05-19 MED ORDER — HYDROCODONE-ACETAMINOPHEN 10-325 MG PO TABS
1.0000 | ORAL_TABLET | Freq: Three times a day (TID) | ORAL | Status: DC
  Administered 2016-05-19 – 2016-05-20 (×5): 1 via ORAL
  Filled 2016-05-19 (×5): qty 1

## 2016-05-19 NOTE — Progress Notes (Signed)
Physical Therapy Treatment Patient Details Name: Katelyn Cook MRN: [id number] DOB: Nov 16, 1961 Today's Date: 05/19/2016    History of Present Illness 55 y.o. female with medical history significant of rheumatoid arthritis, hypertension, hyperlipidemia, asthma, tobacco abuse, depression, anxiety, chronic back pain, who presents with increased urinary frequency, nausea, vomiting, diarrhea, generalized weakness, pain all over, fall and left wrist pain.    PT Comments    Pt able to increase her ambulation distance to 120' with cane, but still with unsteadiness at times.  Discussed RW with platform if MD approved, and pt states she cannot use a RW in her house.  Pt is declining HHPT at this time stating it is not safe and she is embarrassed by it. Pt reports she has no one that can assist her and her husband.  Noted MD has requested SW consult, which would be beneficial.  Pt would really benefit from some sort of A or at least a home safety evaluation by HHPT if she would agree.  Pt needs a cane for home use  Of note, her husband was brought back to her room in a wheelchair by a hospital employee, upon PT entering room.  Husband states he has been lost for 2 hours and had been wandering the hospital.  He cannot A in caring for pt.   Follow Up Recommendations  Home health PT;Supervision for mobility/OOB     Equipment Recommendations  Cane    Recommendations for Other Services OT consult     Precautions / Restrictions Precautions Precautions: Fall Precaution Comments: Lt wrist fx Restrictions Weight Bearing Restrictions: No    Mobility  Bed Mobility Overal bed mobility: Modified Independent       Supine to sit: Modified independent (Device/Increase time) Sit to supine: Modified independent (Device/Increase time)   General bed mobility comments: HOB elevated and using rail to assist.   Transfers Overall transfer level: Needs assistance Equipment used: Straight cane Transfers: Sit  to/from Stand Sit to Stand: Min guard         General transfer comment: slight unsteadiness upon standing  Ambulation/Gait Ambulation/Gait assistance: Min guard Ambulation Distance (Feet): 120 Feet Assistive device: Straight cane Gait Pattern/deviations: Decreased step length - left;Decreased step length - right;Narrow base of support Gait velocity: decreased   General Gait Details: Pt ambulated with cane in R and kept L arm out to A with balancing herself.  Pt stating she gets cramps in her L leg and has nerve pain going down her leg. Pt is generally unsteady.   Stairs            Wheelchair Mobility    Modified Rankin (Stroke Patients Only)       Balance     Sitting balance-Leahy Scale: Good       Standing balance-Leahy Scale: Fair Standing balance comment: dynamic balance fair with cane. Fair + static                    Cognition Arousal/Alertness: Awake/alert Behavior During Therapy: WFL for tasks assessed/performed Overall Cognitive Status: Within Functional Limits for tasks assessed                      Exercises      General Comments General comments (skin integrity, edema, etc.): Discussed piriformis stretch and massage with pt.  Pt tender to palpation in R hip.      Pertinent Vitals/Pain Pain Assessment: Faces Faces Pain Scale: Hurts even more Pain Location: L Leg Pain  Descriptors / Indicators: Spasm;Numbness Pain Intervention(s): Monitored during session;Limited activity within patient's tolerance;Repositioned    Home Living                      Prior Function            PT Goals (current goals can now be found in the care plan section) Acute Rehab PT Goals Patient Stated Goal: move without pain PT Goal Formulation: With patient Time For Goal Achievement: 06/01/16 Potential to Achieve Goals: Good Progress towards PT goals: Progressing toward goals    Frequency  Min 3X/week    PT Plan Current plan  remains appropriate    Co-evaluation             End of Session Equipment Utilized During Treatment: Gait belt Activity Tolerance: Patient tolerated treatment well Patient left: in bed;with call bell/phone within reach;with family/visitor present     Time: 9179-1505 PT Time Calculation (min) (ACUTE ONLY): 20 min  Charges:  $Gait Training: 8-22 mins                    G Codes:      Katelyn Cook 05/19/2016, 4:51 PM

## 2016-05-19 NOTE — Progress Notes (Signed)
Orthopedic Tech Progress Note Patient Details:  Katelyn Cook [birth year] [id number]  Ortho Devices Type of Ortho Device: Ace wrap, Thumb spica splint Splint Material: Fiberglass Ortho Device/Splint Location: Replacement thumb spica splint Ortho Device/Splint Interventions: Application   Saul Fordyce 05/19/2016, 5:26 PM

## 2016-05-19 NOTE — Progress Notes (Signed)
PROGRESS NOTE    Katelyn Cook  ENI:[id number] DOB: 1961-11-26 DOA: 05/18/2016 PCP: Raynelle Jan., MD   Brief Narrative:  On 05/18/2016 by Dr. Lorretta Harp Katelyn Cook is a 55 y.o. female with medical history significant of rheumatoid arthritis, hypertension, hyperlipidemia, asthma, tobacco abuse, depression, anxiety, chronic back pain, who presents with increased urinary frequency, nausea, vomiting, diarrhea, generalized weakness, pain all over, fall and left wrist pain.  Patient reports that she has been having nausea, vomiting, diarrhea for more a week. She vomited 1 to 2 times each day without blood in the vomitus. She has 2 or 3 bowel movements each day with loose stool. Patient does not have abdominal pain. Patient states that she used antibiotics recently, but she cannot provide a detailed information. She also has increased urinary frequency recently, but no dysuria or burning on urination. She states that she used to take Orencia for rheumatoid arthritis with well control of her joint pain, but she stopped taking his medication 2015 since she could not afford it anymore. She states that she has pain all over, including bilateral hip, lower back, bilateral hands and feet. She feels very weak recently. She states that she had a fall last week due to weakness, and injured her left wrist. She has severe pain over left wrist, 10 out of 10, constant, nonradiating. It is aggravated by movement. Patient does not have unilateral weakness, vision change or hearing loss. No chest pain, shortness of breath, cough. Patient has mild subjective fever, but no chills. Assessment & Plan  UTI (lower urinary tract infection)  -Patient has increased urinary frequency and positive urinalysis, consistent with UTI, which may have contributed to his generalized weakness. -Blood cultures show no growth to date -Urine culture shows 4000 colonies of insignificant growth -was placed on aztreonam- will discontinue  today -Will given one dose of fosfomycin  Nausea vomiting and diarrhea -Etiology is not clear, ?gastroenteritis.  -Patient states that she used antibiotics recently, but she cannot provide the detailed information.  -C diff pcr and GI path panel pending- however patient has not had another bowel movement since admission -Continue antiemetics PRN  Hypokalemia  -Potassium 2.3 and magnesium 1.8 on admission.  -?secondary to GI losses -Currently K 2.8, will continue to replace and obtain repeat magnesium level  Hypertension -continue losartan  Rheumatoid arthritis -used to be well controled with Orencia, unfortunately patient could not afford it  -She has not been seeing her rheumatologist -will treat with prednisone 10 mg daily x 3 days -Continue home meds  Depression and anxiety -Stable -Continue Valium, Celexa  Asthma -stable, continue albuterol PRN   Closed fracture of left wrist with swelling of the wright hand -X-ray of her left wrist showed comminuted fracture through the distal pole of the scaphoid, with mildly displaced fragments -Occurred one week ago -Spoke with ortho- Dr. Mina Marble, recommended outpatient follow up -Continue pain control -Left hand swelling today- likely secondary to ace wrap on left arm, removed wrapping, splint in place.  Will elevated LUE. Ortho tech for possible other stabilizing device.  Protein-calorie malnutrition, severe (HCC) -Nutrition consulted   Tobacco abuse -Smoking cessation discussed -Nicotine patch  Chronic pain, recent fall -Xrays of hip, thoracic and lumbar spine unremarkable for acute fractures/dislocations -PT consulted and appreciated - recommended home health, however patient states that her home is not suitable for HH  Severe malnutrition -Nutrition consulted, continue supplements  Social issues -CM/SW consulted  DVT Prophylaxis Lovenox  Code Status: Full  Family Communication: Husband  at  bedside  Disposition Plan: Admitted for observation  Consultants Ortho, Hand surgery  Procedures  None  Antibiotics   Anti-infectives    Start     Dose/Rate Route Frequency Ordered Stop   05/18/16 0600  aztreonam (AZACTAM) 1 g in dextrose 5 % 50 mL IVPB     1 g 100 mL/hr over 30 Minutes Intravenous Every 8 hours 05/18/16 0410        Subjective:   Katelyn Cook seen and examined today.  Patient continues to complain of weakness and not feeling well. Denies any chest pain or shortness of breath. Feels her left wrist still hurts and swelling of left hand. Denies any triggering or any bowel movement since admission. Denies nausea or vomiting at this time.   Objective:   Filed Vitals:   05/18/16 1421 05/18/16 2114 05/19/16 0632 05/19/16 0916  BP: 138/93 128/75 143/80 137/84  Pulse: 80 77 66 80  Temp: 98.2 F (36.8 C) 98.8 F (37.1 C) 98.1 F (36.7 C)   TempSrc: Oral Oral Oral   Resp: 18 18 18    Height:      Weight:      SpO2: 97% 99% 94% 96%    Intake/Output Summary (Last 24 hours) at 05/19/16 1057 Last data filed at 05/19/16 0950  Gross per 24 hour  Intake   3550 ml  Output   4300 ml  Net   -750 ml   Filed Weights   05/18/16 0500  Weight: 48.1 kg (106 lb 0.7 oz)    Exam  General: Well developed,thin, ill-appearing, older than stated age  HEENT: NCAT,mucous membranes moist.   Cardiovascular: S1 S2 auscultated, no murmurs, RRR  Respiratory: Clear to auscultation bilaterally with equal chest rise  Abdomen: Soft, nontender, nondistended, + bowel sounds  Extremities: warm dry without cyanosis clubbing or edema. LUE in ace wrap, welling of left hand  Neuro: AAOx3, nonfocal  Psych: Normal affect and demeanor   Data Reviewed: I have personally reviewed following labs and imaging studies  CBC:  Recent Labs Lab 05/18/16 0100 05/18/16 0504 05/19/16 0610  WBC 12.7* 12.4* 10.9*  NEUTROABS 5.6  --   --   HGB 10.9* 10.2* 10.1*  HCT 35.2* 34.2* 34.4*  MCV  91.7 94.0 95.6  PLT 473* 406* 398   Basic Metabolic Panel:  Recent Labs Lab 05/18/16 0100 05/18/16 0234 05/18/16 0504 05/19/16 0610  NA 139  --  139 141  K 2.3*  --  2.9* 2.8*  CL 99*  --  102 104  CO2 30  --  28 30  GLUCOSE 89  --  88 87  BUN <5*  --  6 6  CREATININE 0.76  --  0.71 0.67  CALCIUM 8.7*  --  8.2* 8.4*  MG  --  1.8  --   --    GFR: Estimated Creatinine Clearance: 60.3 mL/min (by C-G formula based on Cr of 0.67). Liver Function Tests: No results for input(s): AST, ALT, ALKPHOS, BILITOT, PROT, ALBUMIN in the last 168 hours. No results for input(s): LIPASE, AMYLASE in the last 168 hours. No results for input(s): AMMONIA in the last 168 hours. Coagulation Profile:  Recent Labs Lab 05/18/16 0420  INR 1.06   Cardiac Enzymes: No results for input(s): CKTOTAL, CKMB, CKMBINDEX, TROPONINI in the last 168 hours. BNP (last 3 results) No results for input(s): PROBNP in the last 8760 hours. HbA1C: No results for input(s): HGBA1C in the last 72 hours. CBG: No results for input(s): GLUCAP  in the last 168 hours. Lipid Profile: No results for input(s): CHOL, HDL, LDLCALC, TRIG, CHOLHDL, LDLDIRECT in the last 72 hours. Thyroid Function Tests: No results for input(s): TSH, T4TOTAL, FREET4, T3FREE, THYROIDAB in the last 72 hours. Anemia Panel: No results for input(s): VITAMINB12, FOLATE, FERRITIN, TIBC, IRON, RETICCTPCT in the last 72 hours. Urine analysis:    Component Value Date/Time   COLORURINE YELLOW 05/18/2016 0135   APPEARANCEUR CLEAR 05/18/2016 0135   LABSPEC 1.004* 05/18/2016 0135   PHURINE 6.5 05/18/2016 0135   GLUCOSEU NEGATIVE 05/18/2016 0135   HGBUR NEGATIVE 05/18/2016 0135   BILIRUBINUR NEGATIVE 05/18/2016 0135   KETONESUR NEGATIVE 05/18/2016 0135   PROTEINUR NEGATIVE 05/18/2016 0135   UROBILINOGEN 0.2 04/02/2012 1418   NITRITE NEGATIVE 05/18/2016 0135   LEUKOCYTESUR MODERATE* 05/18/2016 0135   Sepsis  Labs: @LABRCNTIP (procalcitonin:4,lacticidven:4)  ) Recent Results (from the past 240 hour(s))  Culture, blood (x 2)     Status: None (Preliminary result)   Collection Time: 05/18/16  4:20 AM  Result Value Ref Range Status   Specimen Description BLOOD LEFT ARM  Final   Special Requests BOTTLES DRAWN AEROBIC AND ANAEROBIC  Final   Culture NO GROWTH 1 DAY  Final   Report Status PENDING  Incomplete  Culture, blood (x 2)     Status: None (Preliminary result)   Collection Time: 05/18/16  4:33 AM  Result Value Ref Range Status   Specimen Description BLOOD LEFT HAND  Final   Special Requests BOTTLES DRAWN AEROBIC AND ANAEROBIC  Final   Culture NO GROWTH 1 DAY  Final   Report Status PENDING  Incomplete  Urine culture     Status: Abnormal   Collection Time: 05/18/16  9:14 AM  Result Value Ref Range Status   Specimen Description URINE, CLEAN CATCH  Final   Special Requests NONE  Final   Culture 4,000 COLONIES/mL INSIGNIFICANT GROWTH (A)  Final   Report Status 05/19/2016 FINAL  Final      Radiology Studies: Dg Thoracic Spine 2 View  05/18/2016  CLINICAL DATA:  Pain within last week with pain all over the thoracic spine. EXAM: THORACIC SPINE 2 VIEWS COMPARISON:  Chest radiograph 10/17/2015 FINDINGS: Anterior cervical plate fusion at C5-C7. Minimal curvature in the thoracic spine. The vertebral body heights are maintained. Mild degenerative endplate changes in the thoracic spine. Normal alignment at the cervicothoracic junction and thoracolumbar junction. IMPRESSION: No acute abnormality. Electronically Signed   By: Richarda Overlie M.D.   On: 05/18/2016 13:32   Dg Lumbar Spine 2-3 Views  05/18/2016  CLINICAL DATA:  Larey Seat within the past week with pain all over the thoracic and lumbar spine. Pain in both hips. EXAM: LUMBAR SPINE - 2-3 VIEW COMPARISON:  09/23/2013 FINDINGS: Minimal dextroscoliosis of the lumbar spine is unchanged. Again noted is pedicle screw and rod fixation at L5-S1 with  interbody device. Surgical hardware appears to be intact. Atherosclerotic calcifications in the abdominal aorta. The vertebral body heights are maintained. Minimal anterolisthesis at L4-L5. IMPRESSION: No acute abnormality in lumbar spine. Electronically Signed   By: Richarda Overlie M.D.   On: 05/18/2016 13:26   Dg Wrist Complete Left  05/17/2016  CLINICAL DATA:  Status post fall, with posterior left wrist pain and swelling. Initial encounter. EXAM: LEFT WRIST - COMPLETE 3+ VIEW COMPARISON:  None. FINDINGS: There is a comminuted fracture through the distal pole of the scaphoid, with mildly displaced fragments. The remaining carpal bones are grossly unremarkable. The joint spaces are preserved. Surrounding soft  tissue swelling is noted at the wrist. IMPRESSION: Comminuted fracture through the distal pole of the scaphoid, with mildly displaced fragments. Electronically Signed   By: Roanna Raider M.D.   On: 05/17/2016 20:57   Dg Hips Bilat With Pelvis Min 5 Views  05/18/2016  CLINICAL DATA:  55 year old female with recent fall and continued pain. Initial encounter. EXAM: DG HIP (WITH OR WITHOUT PELVIS) 5+V BILAT COMPARISON:  [facility] CT Abdomen and Pelvis 03/24/2013 report (no images available). FINDINGS: Lower lumbar posterior and interbody fusion hardware. Pelvis intact. SI joints and sacral ala appear intact. Proximal right femur intact. Proximal left femur intact. Mild osteopenia diffusely. IMPRESSION: No acute fracture or dislocation identified at the bilateral hips or pelvis. Electronically Signed   By: Odessa Fleming M.D.   On: 05/18/2016 13:33     Scheduled Meds: . aspirin EC  325 mg Oral BID  . aztreonam  1 g Intravenous Q8H  . B-complex with vitamin C  1 tablet Oral Daily  . citalopram  10 mg Oral Daily  . enoxaparin (LOVENOX) injection  40 mg Subcutaneous Q24H  . estradiol  2 mg Oral Daily  . famotidine  20 mg Oral Daily  . feeding supplement (ENSURE ENLIVE)  237 mL Oral TID BM  . losartan   50 mg Oral Daily  . nicotine  21 mg Transdermal Daily  . potassium chloride  40 mEq Oral BID  . predniSONE  10 mg Oral Q breakfast  . sodium chloride flush  3 mL Intravenous Q12H  . vitamin C  500 mg Oral Daily   Continuous Infusions: . sodium chloride 75 mL/hr at 05/19/16 0155       Time Spent in minutes   30 minutes  Shantara Goosby D.O. on 05/19/2016 at 10:57 AM  Between 7am to 7pm - Pager - [phone]  After 7pm go to [email or web address] - password [email or web address]  And look for the night coverage person covering for me after hours  [facility] Office  [phone]

## 2016-05-20 DIAGNOSIS — J4520 Mild intermittent asthma, uncomplicated: Secondary | ICD-10-CM | POA: Diagnosis not present

## 2016-05-20 DIAGNOSIS — F419 Anxiety disorder, unspecified: Secondary | ICD-10-CM | POA: Diagnosis not present

## 2016-05-20 DIAGNOSIS — Z720 Tobacco use: Secondary | ICD-10-CM | POA: Diagnosis not present

## 2016-05-20 DIAGNOSIS — N390 Urinary tract infection, site not specified: Secondary | ICD-10-CM | POA: Diagnosis not present

## 2016-05-20 LAB — BASIC METABOLIC PANEL
ANION GAP: 6 (ref 5–15)
BUN: <5 mg/dL — ABNORMAL LOW (ref 6–20)
CO2: 29 mmol/L (ref 22–32)
Calcium: 8.1 mg/dL — ABNORMAL LOW (ref 8.9–10.3)
Chloride: 106 mmol/L (ref 101–111)
Creatinine, Ser: 0.58 mg/dL (ref 0.44–1.00)
GLUCOSE: 84 mg/dL (ref 65–99)
POTASSIUM: 3.2 mmol/L — AB (ref 3.5–5.1)
Sodium: 141 mmol/L (ref 135–145)

## 2016-05-20 LAB — CBC
HCT: 33.4 % — ABNORMAL LOW (ref 36.0–46.0)
Hemoglobin: 9.8 g/dL — ABNORMAL LOW (ref 12.0–15.0)
MCH: 28 pg (ref 26.0–34.0)
MCHC: 29.3 g/dL — ABNORMAL LOW (ref 30.0–36.0)
MCV: 95.4 fL (ref 78.0–100.0)
PLATELETS: 390 10*3/uL (ref 150–400)
RBC: 3.5 MIL/uL — AB (ref 3.87–5.11)
RDW: 17.5 % — ABNORMAL HIGH (ref 11.5–15.5)
WBC: 12.1 10*3/uL — AB (ref 4.0–10.5)

## 2016-05-20 MED ORDER — POTASSIUM CHLORIDE CRYS ER 20 MEQ PO TBCR
40.0000 meq | EXTENDED_RELEASE_TABLET | Freq: Two times a day (BID) | ORAL | Status: DC
  Administered 2016-05-20: 40 meq via ORAL
  Filled 2016-05-20: qty 2

## 2016-05-20 MED ORDER — NICOTINE 21 MG/24HR TD PT24: 21.0000 mg | MEDICATED_PATCH | Freq: Every day | TRANSDERMAL | Status: DC

## 2016-05-20 MED ORDER — ENSURE ENLIVE PO LIQD: 237.0000 mL | Freq: Three times a day (TID) | ORAL | Status: DC

## 2016-05-20 MED ORDER — LOSARTAN POTASSIUM 50 MG PO TABS: 50.0000 mg | ORAL_TABLET | Freq: Every day | ORAL | Status: AC

## 2016-05-20 NOTE — Discharge Summary (Signed)
Physician Discharge Summary  Katelyn Cook GGY:[id number] DOB: 1960-12-18 DOA: 05/18/2016  PCP: Katelyn Jan., MD  Admit date: 05/18/2016 Discharge date: 05/20/2016  Time spent: 45 minutes  Recommendations for Outpatient Follow-up:  Patient will be discharged to home with home health.  Patient will need to follow up with primary care provider within one week of discharge, repeat CBC, BMP, Magnesium. Follow up with Katelyn Cook, orthopedics.  Patient should continue medications as prescribed.  Patient should follow a heart healthy diet.   Discharge Diagnoses:  UTI (lower urinary tract infection)  Nausea vomiting and diarrhea Leukocytosis Hypokalemia  Hypertension Rheumatoid arthritis Depression and anxiety Asthma Closed fracture of left wrist with swelling of the wright hand Protein-calorie malnutrition, severe (HCC) Tobacco abuse Chronic pain, recent fall Severe malnutrition Social issues  Discharge Condition: Stable  Diet recommendation: Heart healthy  Filed Weights   05/18/16 0500  Weight: 48.1 kg (106 lb 0.7 oz)    History of present illness:  On 05/18/2016 by Katelyn Cook Katelyn Cook is a 55 y.o. female with medical history significant of rheumatoid arthritis, hypertension, hyperlipidemia, asthma, tobacco abuse, depression, anxiety, chronic back pain, who presents with increased urinary frequency, nausea, vomiting, diarrhea, generalized weakness, pain all over, fall and left wrist pain.  Patient reports that she has been having nausea, vomiting, diarrhea for more a week. She vomited 1 to 2 times each day without blood in the vomitus. She has 2 or 3 bowel movements each day with loose stool. Patient does not have abdominal pain. Patient states that she used antibiotics recently, but she cannot provide a detailed information. She also has increased urinary frequency recently, but no dysuria or burning on urination. She states that she used to take Orencia for rheumatoid  arthritis with well control of her joint pain, but she stopped taking his medication 2015 since she could not afford it anymore. She states that she has pain all over, including bilateral hip, lower back, bilateral hands and feet. She feels very weak recently. She states that she had a fall last week due to weakness, and injured her left wrist. She has severe pain over left wrist, 10 out of 10, constant, nonradiating. It is aggravated by movement. Patient does not have unilateral weakness, vision change or hearing loss. No chest pain, shortness of breath, cough. Patient has mild subjective fever, but no chills.  Hospital Course:  UTI (lower urinary tract infection)  -Patient has increased urinary frequency and positive urinalysis, consistent with UTI, which may have contributed to his generalized weakness. -Blood cultures show no growth to date -Urine culture shows 4000 colonies of insignificant growth -was placed on aztreonam- and given one dose of fosfomycin  Nausea vomiting and diarrhea -Etiology is not clear, ?gastroenteritis.  -Resolved, no further complaints since admission -Patient states that she used antibiotics recently, but she cannot provide the detailed information.  -C diff pcr and GI path panel pending- however patient has not had another bowel movement since admission -Continue antiemetics PRN  Leukocytosis -Likely reactive as patient was placed on steriods -Repeat CBC in one week  Hypokalemia  -?secondary to GI losses -Potassium 3.2, will continue to replace -Magnesium 1.9 -Repeat BMP and magnesium in one week  Hypertension -continue losartan  Rheumatoid arthritis -used to be well controled with Orencia, unfortunately patient could not afford it  -She has not been seeing her rheumatologist -Given prednisone for 3 days -Continue home meds  Depression and anxiety -Stable -Continue Valium, Celexa  Asthma -stable, continue albuterol  PRN   Closed fracture  of left wrist with swelling of the wright hand -X-ray of her left wrist showed comminuted fracture through the distal pole of the scaphoid, with mildly displaced fragments -Occurred one week ago -Spoke with ortho- Katelyn Cook, recommended outpatient follow up -Continue pain control -Left hand swelling resolved -Ortho tech placed ACE wrap with thumb spica splint  Protein-calorie malnutrition, severe (HCC) -Nutrition consulted   Tobacco abuse -Smoking cessation discussed -Nicotine patch  Chronic pain, recent fall -Xrays of hip, thoracic and lumbar spine unremarkable for acute fractures/dislocations -PT consulted and appreciated - recommended home health, however patient states that her home is not suitable for HH  Severe malnutrition -Nutrition consulted, continue supplements  Social issues -CM/SW consulted  Consultants Ortho, Hand surgery  Procedures  None  Discharge Exam: Filed Vitals:   05/20/16 0445 05/20/16 0955  BP: 135/82 128/75  Pulse: 65 73  Temp: 97.7 F (36.5 C)   Resp: 16    Exam  General: Well developed,thin, ill-appearing, older than stated age  HEENT: NCAT,mucous membranes moist.   Cardiovascular: S1 S2 auscultated, no murmurs, RRR  Respiratory: Clear to auscultation bilaterally   Abdomen: Soft, nontender, nondistended, + bowel sounds  Extremities: warm dry without cyanosis clubbing or edema. LUE wrapped, hand swelling resolved  Neuro: AAOx3, nonfocal  Psych: Normal affect and demeanor  Discharge Instructions     Discharge Instructions    Discharge instructions    Complete by:  As directed   Patient will be discharged to home with home health.  Patient will need to follow up with primary care provider within one week of discharge, repeat CBC, BMP, Magnesium. Follow up with Katelyn Cook, orthopedics.  Patient should continue medications as prescribed.  Patient should follow a heart healthy diet.            Medication List    STOP  taking these medications        levofloxacin 500 MG tablet  Commonly known as:  LEVAQUIN     predniSONE 10 MG tablet  Commonly known as:  DELTASONE      TAKE these medications        aspirin EC 325 MG tablet  Take 1 tablet (325 mg total) by mouth 2 (two) times daily.     BC HEADACHE POWDER PO  Take 2 packets by mouth 5 (five) times daily as needed (pain).     citalopram 40 MG tablet  Commonly known as:  CELEXA  Take 40 mg by mouth daily.     COMBIVENT RESPIMAT 20-100 MCG/ACT Aers respimat  Generic drug:  Ipratropium-Albuterol  Inhale 1 puff into the lungs every 6 (six) hours.     Cyanocobalamin 2500 MCG Tabs  Take 2,500 mcg by mouth daily. Vitamin b12     diazepam 5 MG tablet  Commonly known as:  VALIUM  Take 5 mg by mouth 2 (two) times daily with a meal.     estradiol 2 MG tablet  Commonly known as:  ESTRACE  Take 2 mg by mouth daily.     feeding supplement (ENSURE ENLIVE) Liqd  Take 237 mLs by mouth 3 (three) times daily between meals.     HYDROcodone-acetaminophen 10-325 MG tablet  Commonly known as:  NORCO  Take 1 tablet by mouth 3 (three) times daily. scheduled     losartan 50 MG tablet  Commonly known as:  COZAAR  Take 1 tablet (50 mg total) by mouth daily.     nicotine 21 mg/24hr patch  Commonly known as:  NICODERM CQ - dosed in mg/24 hours  Place 1 patch (21 mg total) onto the skin daily.     vitamin C 500 MG tablet  Commonly known as:  ASCORBIC ACID  Take 1 tablet (500 mg total) by mouth daily.       Allergies  Allergen Reactions  . Leflunomide Other (See Comments), Itching and Swelling  . Codeine Itching  . Erythromycin Other (See Comments)    blisters  . Penicillins Other (See Comments)    Blisters - childhood reaction Has patient had a PCN reaction causing immediate rash, facial/tongue/throat swelling, SOB or lightheadedness with hypotension: Yes Has patient had a PCN reaction causing severe rash involving mucus membranes or skin necrosis:  No Has patient had a PCN reaction that required hospitalization No Has patient had a PCN reaction occurring within the last 10 years: No If all of the above answers are "NO", then may proceed with Cephalosporin use.  Marland Kitchen Risedronate Itching  . Statins Itching    Face red  . Cefaclor Rash  . Ciprofloxacin Rash  . Ezetimibe Rash  . Rosuvastatin Calcium Itching    Face red  . Sulfa Antibiotics Rash    itching      The results of significant diagnostics from this hospitalization (including imaging, microbiology, ancillary and laboratory) are listed below for reference.    Significant Diagnostic Studies: Dg Thoracic Spine 2 View  05/18/2016  CLINICAL DATA:  Pain within last week with pain all over the thoracic spine. EXAM: THORACIC SPINE 2 VIEWS COMPARISON:  Chest radiograph 10/17/2015 FINDINGS: Anterior cervical plate fusion at C5-C7. Minimal curvature in the thoracic spine. The vertebral body heights are maintained. Mild degenerative endplate changes in the thoracic spine. Normal alignment at the cervicothoracic junction and thoracolumbar junction. IMPRESSION: No acute abnormality. Electronically Signed   By: Richarda Overlie M.D.   On: 05/18/2016 13:32   Dg Lumbar Spine 2-3 Views  05/18/2016  CLINICAL DATA:  Larey Seat within the past week with pain all over the thoracic and lumbar spine. Pain in both hips. EXAM: LUMBAR SPINE - 2-3 VIEW COMPARISON:  09/23/2013 FINDINGS: Minimal dextroscoliosis of the lumbar spine is unchanged. Again noted is pedicle screw and rod fixation at L5-S1 with interbody device. Surgical hardware appears to be intact. Atherosclerotic calcifications in the abdominal aorta. The vertebral body heights are maintained. Minimal anterolisthesis at L4-L5. IMPRESSION: No acute abnormality in lumbar spine. Electronically Signed   By: Richarda Overlie M.D.   On: 05/18/2016 13:26   Dg Wrist Complete Left  05/17/2016  CLINICAL DATA:  Status post fall, with posterior left wrist pain and swelling.  Initial encounter. EXAM: LEFT WRIST - COMPLETE 3+ VIEW COMPARISON:  None. FINDINGS: There is a comminuted fracture through the distal pole of the scaphoid, with mildly displaced fragments. The remaining carpal bones are grossly unremarkable. The joint spaces are preserved. Surrounding soft tissue swelling is noted at the wrist. IMPRESSION: Comminuted fracture through the distal pole of the scaphoid, with mildly displaced fragments. Electronically Signed   By: Roanna Raider M.D.   On: 05/17/2016 20:57   Dg Hips Bilat With Pelvis Min 5 Views  05/18/2016  CLINICAL DATA:  55 year old female with recent fall and continued pain. Initial encounter. EXAM: DG HIP (WITH OR WITHOUT PELVIS) 5+V BILAT COMPARISON:  [facility] CT Abdomen and Pelvis 03/24/2013 report (no images available). FINDINGS: Lower lumbar posterior and interbody fusion hardware. Pelvis intact. SI joints and sacral ala appear intact. Proximal right femur intact.  Proximal left femur intact. Mild osteopenia diffusely. IMPRESSION: No acute fracture or dislocation identified at the bilateral hips or pelvis. Electronically Signed   By: Odessa Fleming M.D.   On: 05/18/2016 13:33    Microbiology: Recent Results (from the past 240 hour(s))  Culture, blood (x 2)     Status: None (Preliminary result)   Collection Time: 05/18/16  4:20 AM  Result Value Ref Range Status   Specimen Description BLOOD LEFT ARM  Final   Special Requests BOTTLES DRAWN AEROBIC AND ANAEROBIC  Final   Culture NO GROWTH 1 DAY  Final   Report Status PENDING  Incomplete  Culture, blood (x 2)     Status: None (Preliminary result)   Collection Time: 05/18/16  4:33 AM  Result Value Ref Range Status   Specimen Description BLOOD LEFT HAND  Final   Special Requests BOTTLES DRAWN AEROBIC AND ANAEROBIC  Final   Culture NO GROWTH 1 DAY  Final   Report Status PENDING  Incomplete  Urine culture     Status: Abnormal   Collection Time: 05/18/16  9:14 AM  Result Value Ref Range  Status   Specimen Description URINE, CLEAN CATCH  Final   Special Requests NONE  Final   Culture 4,000 COLONIES/mL INSIGNIFICANT GROWTH (A)  Final   Report Status 05/19/2016 FINAL  Final     Labs: Basic Metabolic Panel:  Recent Labs Lab 05/18/16 0100 05/18/16 0234 05/18/16 0504 05/19/16 0610 05/19/16 1102 05/20/16 0409  NA 139  --  139 141  --  141  K 2.3*  --  2.9* 2.8*  --  3.2*  CL 99*  --  102 104  --  106  CO2 30  --  28 30  --  29  GLUCOSE 89  --  88 87  --  84  BUN <5*  --  6 6  --  <5*  CREATININE 0.76  --  0.71 0.67  --  0.58  CALCIUM 8.7*  --  8.2* 8.4*  --  8.1*  MG  --  1.8  --   --  1.9  --    Liver Function Tests: No results for input(s): AST, ALT, ALKPHOS, BILITOT, PROT, ALBUMIN in the last 168 hours. No results for input(s): LIPASE, AMYLASE in the last 168 hours. No results for input(s): AMMONIA in the last 168 hours. CBC:  Recent Labs Lab 05/18/16 0100 05/18/16 0504 05/19/16 0610 05/20/16 0409  WBC 12.7* 12.4* 10.9* 12.1*  NEUTROABS 5.6  --   --   --   HGB 10.9* 10.2* 10.1* 9.8*  HCT 35.2* 34.2* 34.4* 33.4*  MCV 91.7 94.0 95.6 95.4  PLT 473* 406* 398 390   Cardiac Enzymes: No results for input(s): CKTOTAL, CKMB, CKMBINDEX, TROPONINI in the last 168 hours. BNP: BNP (last 3 results) No results for input(s): BNP in the last 8760 hours.  ProBNP (last 3 results) No results for input(s): PROBNP in the last 8760 hours.  CBG: No results for input(s): GLUCAP in the last 168 hours.     SignedEdsel Petrin  [facility] 05/20/2016, 11:28 AM

## 2016-05-20 NOTE — Clinical Social Work Note (Signed)
CSW contacted by OT regarding pt  Indicating that the pt was declining to discharge home per PT/OT recommendation. Pt informed OT that  her home is not fit to live in. Pt said there is mold in the home, and that the home has no running water.   CSW followed up with Pt and provided resource for pt to follow up with the [employer].    Starr Lake, MSW, Theresia Majors

## 2016-05-20 NOTE — Progress Notes (Signed)
Occupational Therapy Evaluation Patient Details Name: Katelyn Cook MRN: [id number] DOB: 12-02-1961 Today's Date: 05/20/2016    History of Present Illness 55 y.o. female with medical history significant of rheumatoid arthritis, hypertension, hyperlipidemia, asthma, tobacco abuse, depression, anxiety, chronic back pain, who presents with increased urinary frequency, nausea, vomiting, diarrhea, generalized weakness, pain all over, fall and left wrist pain.   Clinical Impression   Pt admitted with the above diagnoses and presents with below problem list. Pt will benefit from continued acute OT to address the below listed deficits and maximize independence with BADLs prior to d/c to venue below. PTA pt was independent with ADLs. Pt is currently setup to min guard with ADLs. Educated on edema control. Advised pt to WB through elbow only while clarification sought by MD. Of note, pt reporting unlivable/unsafe living conditions at her current residence. Would benefit from SW consult.      Follow Up Recommendations  Home health OT;Supervision - Intermittent    Equipment Recommendations  3 in 1 bedside comode    Recommendations for Other Services Other (comment) (Social Work)     Precautions / Restrictions Precautions Precautions: Fall Precaution Comments: Lt wrist fx Restrictions Weight Bearing Restrictions:  (Advised pt to WB through L elbow and avoid WB hand/wrist)      Mobility Bed Mobility Overal bed mobility: Modified Independent       Supine to sit: Modified independent (Device/Increase time) Sit to supine: Modified independent (Device/Increase time)   General bed mobility comments: HOB elevated and using rail to assist. Cued to try to avoid WB throught hand/wrist.   Transfers Overall transfer level: Needs assistance Equipment used: 1 person hand held assist Transfers: Sit to/from [employer] Sit to Stand: Min guard Stand pivot transfers: Min guard        General transfer comment: HHA +1 and SPT EOB <> BSC. unsteadiness noted.     Balance Overall balance assessment: Needs assistance;History of Falls Sitting-balance support: No upper extremity supported;Feet supported Sitting balance-Leahy Scale: Good     Standing balance support: No upper extremity supported;Single extremity supported Standing balance-Leahy Scale: Fair                              ADL Overall ADL's : Needs assistance/impaired Eating/Feeding: Set up;Sitting   Grooming: Set up;Minimal assistance;Sitting Grooming Details (indicate cue type and reason): min A for bilateral tasks, setup otherwise. Upper Body Bathing: Set up;Sitting;Minimal assitance   Lower Body Bathing: Min guard;Sit to/from stand   Upper Body Dressing : Set up;Minimal assistance;Sitting   Lower Body Dressing: Min guard;Sit to/from stand   Toilet Transfer: Min Barrister's clerk Details (indicate cue type and reason): no cane in room so limited pt to SPT to [facility] Toileting- Architect and Hygiene: Min guard;Sit to/from stand         General ADL Comments: Pt explaining home environment and therapist spent time discussing strategies for ADLs and BSC recommendation as well as recommendation for SW consult. Toilet transfer and bed mobility as detailed above.      Vision     Perception     Praxis      Pertinent Vitals/Pain Pain Assessment: 0-10 Pain Score: 10-Worst pain ever Pain Location: L hand and wrist  Pain Descriptors / Indicators: Aching;Sore Pain Intervention(s): Limited activity within patient's tolerance;Monitored during session;Repositioned     Hand Dominance Right   Extremity/Trunk Assessment Upper Extremity Assessment Upper Extremity Assessment: LUE deficits/detail LUE  Deficits / Details: L thumb spica split. Educated pt on elevating L hand and wiggling digits not immobilized aby splint. Advised to WB through elbow and avoid WB  through hand/wrist. Pt needing cues for this during transfers and bed mobility.  LUE: Unable to fully assess due to immobilization   Lower Extremity Assessment Lower Extremity Assessment: Defer to PT evaluation   Cervical / Trunk Assessment Cervical / Trunk Assessment: Kyphotic   Communication Communication Communication: No difficulties   Cognition Arousal/Alertness: Awake/alert Behavior During Therapy: WFL for tasks assessed/performed Overall Cognitive Status: Within Functional Limits for tasks assessed                     General Comments       Exercises       Shoulder Instructions      Home Living Family/patient expects to be discharged to:: Private residence Living Arrangements: Spouse/significant other   Type of Home: House Home Access: Stairs to enter [employer] of Steps: 4-5 Entrance Stairs-Rails: Right (reports unstable rail, not sure if she can use it.) Home Layout: One level               Home Equipment: None   Additional Comments: Pt reports that 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 She states that he is unable to assist her at home. Pt reports house is unlivable though she and spouse have no where else to go. Mold, unstable flooring, no water or gas for the past few months. Pt has been dring to aunt's house to fill up jugs for water. She also reports they have no bed and commode has recently fallen in.       Prior Functioning/Environment Level of Independence: Independent        Comments: denies using any assistive device. reports falls at home.    OT Diagnosis: Acute pain;Generalized weakness   OT Problem List: Decreased activity tolerance;Impaired balance (sitting and/or standing);Decreased knowledge of use of DME or AE;Decreased knowledge of precautions;Pain;Impaired UE functional use   OT Treatment/Interventions: Self-care/ADL training;DME and/or AE instruction;Therapeutic  activities;Patient/family education;Balance training    OT Goals(Current goals can be found in the care plan section) Acute Rehab OT Goals Patient Stated Goal: move without pain OT Goal Formulation: With patient Time For Goal Achievement: 05/27/16 Potential to Achieve Goals: Good ADL Goals Pt Will Perform Grooming: with modified independence;sitting Pt Will Perform Upper Body Bathing: with modified independence;sitting;with adaptive equipment Pt Will Perform Lower Body Bathing: with modified independence;sitting/lateral leans;sit to/from stand Pt Will Perform Upper Body Dressing: with modified independence;sitting Pt Will Perform Lower Body Dressing: with modified independence;sit to/from stand Pt Will Transfer to Toilet: with modified independence;ambulating;bedside commode Pt Will Perform Toileting - Clothing Manipulation and hygiene: with modified independence;sitting/lateral leans Additional ADL Goal #1: Pt will complete bed mobility at mod I level to prepare for OOB ADLs.   OT Frequency: Min 2X/week   Barriers to D/C: Inaccessible home environment;Decreased caregiver support  Caregiver to spouse. Poor/unsafe living conditions.        Co-evaluation              End of Session Nurse Communication: Other (comment) (advised pt to WB through elbow only. 10/10 pain.)  Activity Tolerance: Patient tolerated treatment well Patient left: in bed;with call bell/phone within reach;with nursing/sitter in room   Time: 6503-5465 OT Time Calculation (min): 22 min Charges:  OT General Charges $OT Visit: 1 Procedure OT Evaluation $OT Eval Low Complexity:  1 Procedure G-Codes:    Pilar Grammes 04-Jun-2016, 10:20 AM

## 2016-05-20 NOTE — Progress Notes (Signed)
Pt discharged to home.  Discharge instructions explained to pt.  Pt has no questions at the time of discharge.  IV removed.  Pt states she has all belongings.  Pt taken off unit in wheelchair by staff and pt drove away in her own private vehicle.

## 2016-05-20 NOTE — Progress Notes (Signed)
CM met with pt in room to offer choice of home health agency.  Pt declines ALL home health services.  CM called [facility] DME rep, Merry Proud to please deliver the 3n1 and cane to room so pt can discharge.  No other CM needs were communicated.

## 2016-05-23 LAB — CULTURE, BLOOD (ROUTINE X 2)
CULTURE: NO GROWTH
Culture: NO GROWTH

## 2016-05-31 NOTE — Progress Notes (Signed)
   05/20/16 1040  OT Visit Information  Last OT Received On 05/20/16  History of Present Illness 55 y.o. female with medical history significant of rheumatoid arthritis, hypertension, hyperlipidemia, asthma, tobacco abuse, depression, anxiety, chronic back pain, who presents with increased urinary frequency, nausea, vomiting, diarrhea, generalized weakness, pain all over, fall and left wrist pain.  OT G-codes **NOT FOR INPATIENT CLASS**  Functional Assessment Tool Used clinical judgement  Functional Limitation Self care  Self Care Current Status (U9811) CI  Self Care Goal Status (B1478) CI   Late entry for G code status.  Raynald Kemp OTR/L Pager: [phone]

## 2016-10-17 HISTORY — PX: CATARACT EXTRACTION, BILATERAL: SHX1313

## 2017-04-11 ENCOUNTER — Ambulatory Visit: Payer: Medicare Other | Admitting: Neurology

## 2017-04-15 ENCOUNTER — Ambulatory Visit (INDEPENDENT_AMBULATORY_CARE_PROVIDER_SITE_OTHER): Payer: Medicare Other | Admitting: Neurology

## 2017-04-15 ENCOUNTER — Encounter: Payer: Self-pay | Admitting: Neurology

## 2017-04-15 VITALS — Ht 67.0 in | Wt 120.0 lb

## 2017-04-15 DIAGNOSIS — R4020 Unspecified coma: Secondary | ICD-10-CM

## 2017-04-15 DIAGNOSIS — R251 Tremor, unspecified: Secondary | ICD-10-CM | POA: Diagnosis not present

## 2017-04-15 NOTE — Patient Instructions (Addendum)
Remember to drink plenty of fluid, eat healthy meals and do not skip any meals. Try to eat protein with a every meal and eat a healthy snack such as fruit or nuts in between meals. Try to keep a regular sleep-wake schedule and try to exercise daily, particularly in the form of walking, 20-30 minutes a day, if you can.   As far as diagnostic testing: MRI brain, lab, eeg  I would like to see you back in 4 months, sooner if we need to. Please call us with any interim questions, concerns, problems, updates or refill requests.   Our phone number is [phone]. We also have an after hours call service for urgent matters and there is a physician on-call for urgent questions. For any emergencies you know to call 911 or go to the nearest emergency room

## 2017-04-15 NOTE — Progress Notes (Signed)
[facility]    Provider:  Dr Lucia Gaskins Referring Provider: Julio Sicks, NP Primary Care Physician:  Julio Sicks, NP  CC:  shaking  HPI:  Katelyn Cook is a 56 y.o. female here as a referral from Julio Sicks for possible seizure activity. She has had these episodes started 3 years ago when her "nerves went all to pieces" due ot a relationship. She also has Rheumatoid Arthritis. She has been told her shaking is anxiety. She has lost 100 pounds in a year. The last episode was in early March, she was sitting around, she can tell they are coming. . Most times she starts shaking uncontrollable on both sides and she is completely aware, no alteration of consciousness,  her whole body shakes as the EMS talks to her she calms down and stays alert. She sometimes "goes out" which is different than the shaking episode. The shaking episodes are different than the passing out episodes, they have told her she passes out because of blood pressure and other metabolic causes and malnourishment. Her shaking spells she is conscious and aware, her whole body shakes and she can talk to people around her. . When the shakes come on her hands start shaking and the legs but she is aware and she tells people she is fine and eventually it stops if she calms down. Her last shaking episode was in March and none since then. They last 10-15 minutes. She denies confusion afterwards or biting her tongue or urination. No FHx of seizures.   Reviewed notes, labs and imaging from outside physicians, which showed:   Reviewed report 09/2015: CT HEAD WITHOUT CONTRAST  TECHNIQUE: Contiguous axial images were obtained from the base of the skull through the vertex without intravenous contrast.  COMPARISON:02/08/2014  FINDINGS: No skull fracture is noted. Paranasal sinuses and mastoid air cells are unremarkable. No intracranial hemorrhage, mass effect or midline shift. No acute cortical infarction. No mass lesion  is noted on this unenhanced scan.  IMPRESSION: No acute intracranial abnormality.   Electronically Signed By: Osborne Oman M.D. On: 10/17/2015 12:03  Review of Systems: Patient complains of symptoms per HPI as well as the following symptoms: no CP, no SOB. Endorses headache, numbness, weakness, snoring, restless legs, swelling, twitching. Pertinent negatives per HPI. All others negative.   Social History   Social History  . Marital status: Single    Spouse name: N/A  . Number of children: 0  . Years of education: 10   Occupational History  . Disabled    Social History Main Topics  . Smoking status: Current Every Day Smoker    Packs/day: 1.00    Years: 35.00  . Smokeless tobacco: Never Used  . Alcohol use No  . Drug use: No  . Sexual activity: Yes    Birth control/ protection: Surgical   Other Topics Concern  . Not on file   Social History Narrative   Lives w/ a friend   Right-handed   Caffeine: 4 cans Dr. Reino Kent per day    Family History  Problem Relation Age of Onset  . Hypertension Father   . Pneumonia Mother   . Breast cancer Maternal Aunt   . Anesthesia problems Neg Hx   . Hypotension Neg Hx   . Malignant hyperthermia Neg Hx   . Pseudochol deficiency Neg Hx   . Hyperlipidemia Neg Hx   . Heart attack Neg Hx   . Diabetes Neg Hx     Past Medical History:  Diagnosis Date  .  Anxiety    takes Citalopram daily  . Asthma    hx bronchitis-none now  . Bronchitis    hx of-around 2006  . Bruises easily    pt states d/t being on Prednisone since 2006  . Chronic back pain    radiculopathy,lumbago/stenosis/herniated disc  . Chronic constipation    takes Amitiza prn  . Depression   . Hemorrhoids   . Hyperlipidemia    but doesn't take any meds at this time  . Hypertension    takes Losartan daily  . Joint pain   . Joint swelling   . Multiple bruises    on both arms  . Neck pain    herniated disc  . Pneumonia    hx of in 1992  . Rheumatoid  arthritis(714.0)    takes Orencia monthly  . Staph infection    hx of in 1997  . Tobacco abuse   . Urinary incontinence   . Wears dentures    top  . Wears glasses   . Wears partial dentures    lower partial    Past Surgical History:  Procedure Laterality Date  . ABDOMINAL HYSTERECTOMY  1984  . APPENDECTOMY    . BACK SURGERY  4/13   lumb fusion  . CATARACT EXTRACTION, BILATERAL Bilateral 10/2016  . COLONOSCOPY    . DIAGNOSTIC LAPAROSCOPY  early 14's  . ESOPHAGOGASTRECTOMY    . FOOT SURGERY  2010   right foot with screws placed   . FOOT SURGERY     bone fusion 2012  . HARDWARE REMOVAL Right 01/28/2013   Procedure: HARDWARE REMOVAL;  Surgeon: Sherri Rad, MD;  Location: [facility];  Service: Orthopedics;  Laterality: Right;  HARDWARE REMOVAL DEEP LEFT FOOT (GREAT TOE ONLY)  . RECTAL PROLAPSE REPAIR  1981  . TARSAL METATARSAL ARTHRODESIS Right 01/28/2013   Procedure: TARSAL METATARSAL FUSION;  Surgeon: Sherri Rad, MD;  Location: [facility];  Service: Orthopedics;  Laterality: Right;  RIGHT GREAT TOE MTP FUSION REVISION WITH DORSAL PLATE, LOCAL BONE GRAFT   . TONSILLECTOMY     as a child    Current Outpatient Prescriptions  Medication Sig Dispense Refill  . aspirin EC 325 MG tablet Take 1 tablet (325 mg total) by mouth 2 (two) times daily. 90 tablet 0  . Aspirin-Salicylamide-Caffeine (BC HEADACHE POWDER PO) Take 2 packets by mouth 5 (five) times daily as needed (pain).     . citalopram (CELEXA) 40 MG tablet Take 40 mg by mouth daily.  2  . Cyanocobalamin 2500 MCG TABS Take 2,500 mcg by mouth daily. Vitamin b12    . diazepam (VALIUM) 5 MG tablet Take 5 mg by mouth 2 (two) times daily with a meal.     . estradiol (ESTRACE) 2 MG tablet Take 2 mg by mouth daily.    Marland Kitchen HYDROcodone-acetaminophen (NORCO) 10-325 MG tablet Take 1 tablet by mouth 3 (three) times daily. scheduled  0  . Ipratropium-Albuterol (COMBIVENT RESPIMAT) 20-100 MCG/ACT AERS  respimat Inhale 1 puff into the lungs every 6 (six) hours.    Marland Kitchen losartan (COZAAR) 50 MG tablet Take 1 tablet (50 mg total) by mouth daily. 30 tablet 0  . vitamin C (ASCORBIC ACID) 500 MG tablet Take 1 tablet (500 mg total) by mouth daily. 90 tablet 0  . XELJANZ XR 11 MG TB24      No current facility-administered medications for this visit.     Allergies as of 04/15/2017 - Review Complete 04/15/2017  Allergen Reaction Noted  . Leflunomide Other (See Comments), Itching, and Swelling 05/18/2016  . Codeine Itching 05/18/2016  . Erythromycin Other (See Comments) 04/02/2012  . Penicillins Other (See Comments) 04/02/2012  . Risedronate Itching 05/18/2016  . Statins Itching 01/22/2013  . Cefaclor Rash 05/18/2016  . Ciprofloxacin Rash 05/18/2016  . Ezetimibe Rash 05/18/2016  . Rosuvastatin calcium Itching 05/18/2016  . Sulfa antibiotics Rash 04/02/2012    Vitals: Ht 5\' 7"  (1.702 m)   Wt 120 lb (54.4 kg)   BMI 18.79 kg/m  Last Weight:  Wt Readings from Last 1 Encounters:  04/15/17 120 lb (54.4 kg)   Last Height:   Ht Readings from Last 1 Encounters:  04/15/17 5\' 7"  (1.702 m)   Physical exam: Exam: Gen: NAD, conversant, very thin, malnourished                    CV: RRR, no MRG. No Carotid Bruits. No peripheral edema, warm, nontender Eyes: Conjunctivae clear without exudates or hemorrhage  Neuro: Detailed Neurologic Exam  Speech:    Speech is normal; fluent and spontaneous with normal comprehension.  Cognition:    The patient is oriented to person, place, and time;     recent and remote memory intact;     language fluent;     normal attention, concentration,     fund of knowledge Cranial Nerves:    The pupils are equal, round, and reactive to light. Attempted funduscopic exam could not visualize due to small pupils. Visual fields are full to finger confrontation. Extraocular movements are intact. Trigeminal sensation is intact and the muscles of mastication are normal. The  face is symmetric. The palate elevates in the midline. Hearing intact. Voice is normal. Shoulder shrug is normal. The tongue has normal motion without fasciculations.   Coordination:    Mild dysmetria on FTN likely due to joint pain  Gait:    Imbalance, slightly stooped, narrow based gait, antalgic  Motor Observation:    no involuntary movements noted. Tone:    Normal muscle tone.    Posture:    Posture is normal. normal erect    Strength:    4+/5 throughout and giveway due to pain       Sensation: intact to LT     Reflex Exam:  DTR's: Absent AJs otherwise deep tendon reflexes in the upper and lower extremities are brisk bilaterally.   Toes:    The toes are equivocal bilaterally.   Clonus:    Clonus is absent.   Assessment/Plan:  This is a 56 year old with a past medical history of rheumatoid arthritis, malnutrition, dehydration, tobacco abuse, hypertension, hyperlipidemia, depression, chronic back pain, UTI, anxiety. She is here for evaluation of shaking spells that started 3 years ago in the setting of a stressful relationship. Patient reports that she starts shaking all over her body but there is no alteration of awareness and she can converse with people and tell them that she will be fine and they stop when she relaxes. She also has separate episodes of loss of consciousness and she has been told were due to dehydration or metabolic derangements or due to her protein calorie malnutrition which is severe.  Likely not seizures may be non-epileptic (psychogenic) due to anxiety/depression/stress, however need to fully evaluate for any abnormal electrical activity in the brain or seizure focus in the brain. Need an MRI brain w/wo contrast and labs today to check kidney function Routine EEG Can follow with a prolonged EEG if she  continues to have the episodes or they increase Discussed and patient acknowledged: Patient is unable to drive, operate heavy machinery, perform activities  at heights or participate in water activities until 6 months seizure or event free  Orders Placed This Encounter  Procedures  . MR BRAIN W WO CONTRAST  . Basic Metabolic Panel  . EEG    Naomie Dean, MD  [facility] [address] [place], [place] [address]  Phone [phone] Fax [phone]

## 2017-04-16 LAB — BASIC METABOLIC PANEL
BUN/Creatinine Ratio: 7 — ABNORMAL LOW (ref 9–23)
BUN: 6 mg/dL (ref 6–24)
CALCIUM: 8.8 mg/dL (ref 8.7–10.2)
CHLORIDE: 105 mmol/L (ref 96–106)
CO2: 22 mmol/L (ref 18–29)
Creatinine, Ser: 0.84 mg/dL (ref 0.57–1.00)
GFR calc Af Amer: 90 mL/min/{1.73_m2} (ref >59)
GFR, EST NON AFRICAN AMERICAN: 78 mL/min/{1.73_m2} (ref >59)
GLUCOSE: 78 mg/dL (ref 65–99)
POTASSIUM: 4.3 mmol/L (ref 3.5–5.2)
SODIUM: 142 mmol/L (ref 134–144)

## 2017-04-18 ENCOUNTER — Telehealth: Payer: Self-pay | Admitting: *Deleted

## 2017-04-18 NOTE — Telephone Encounter (Signed)
Per Dr Lucia Gaskins, LVM informing patient her labs are unremarkable. Left number for any questions.

## 2017-05-22 ENCOUNTER — Other Ambulatory Visit: Payer: Medicare Other

## 2017-08-15 ENCOUNTER — Ambulatory Visit: Payer: Medicare Other | Admitting: Nurse Practitioner

## 2017-10-17 DEATH — deceased

## 2018-01-21 IMAGING — CR DG THORACIC SPINE 2V
3 series · 3 of 3 positions shown · non-contrast
Comparison: Chest radiograph 10/17/2015

CLINICAL DATA: Pain within last week with pain all over the
thoracic spine.

EXAM:
THORACIC SPINE 2 VIEWS

[t-spine ap]
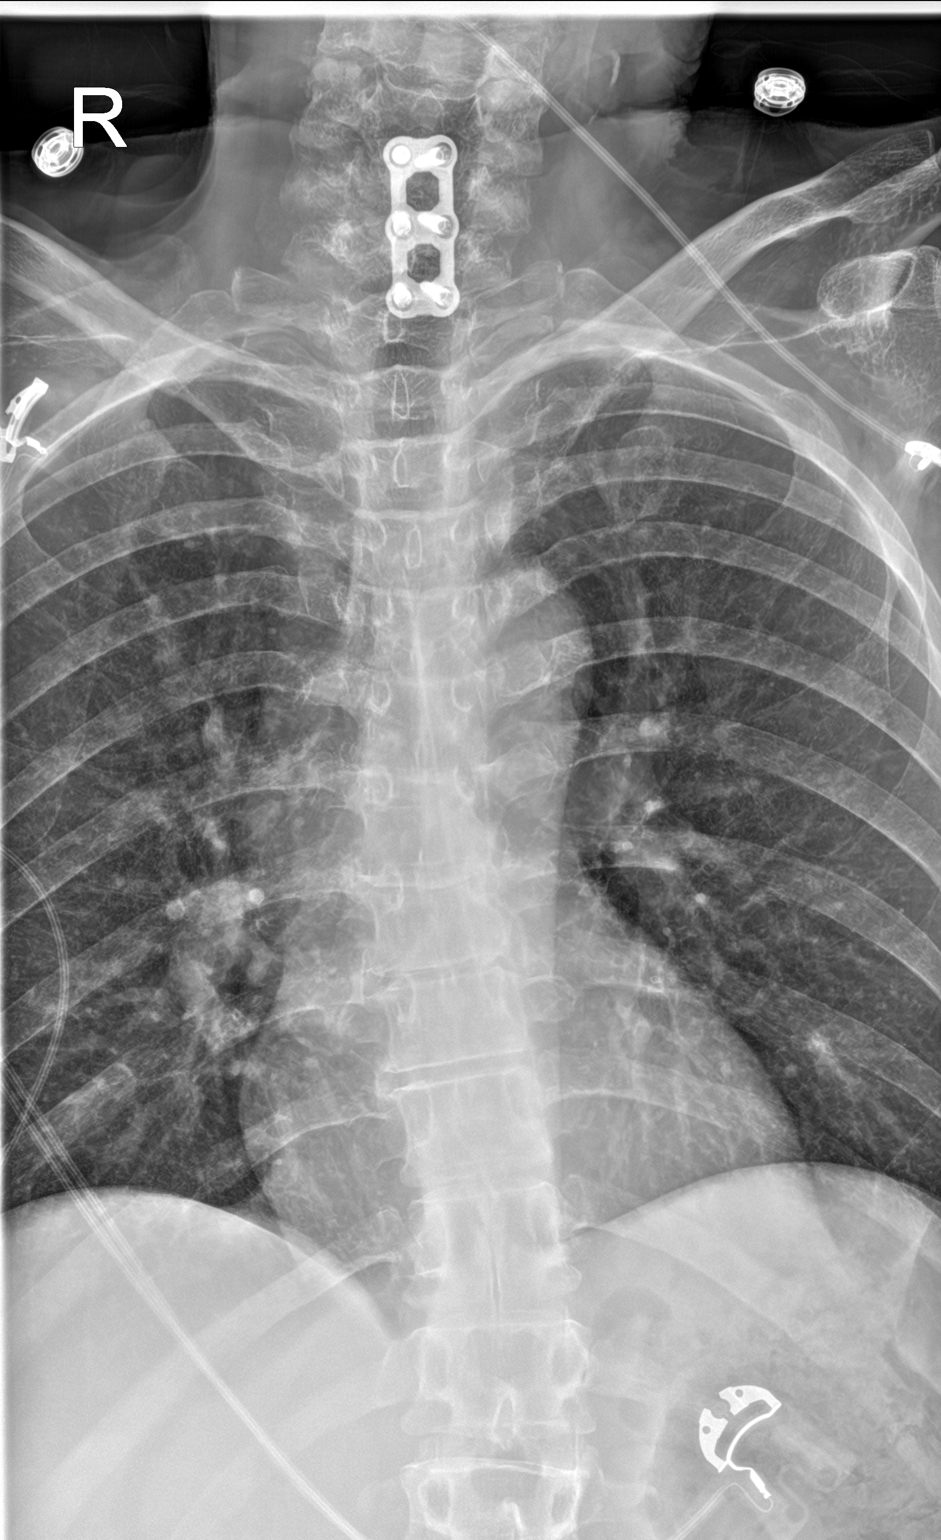

[t-spine lat]
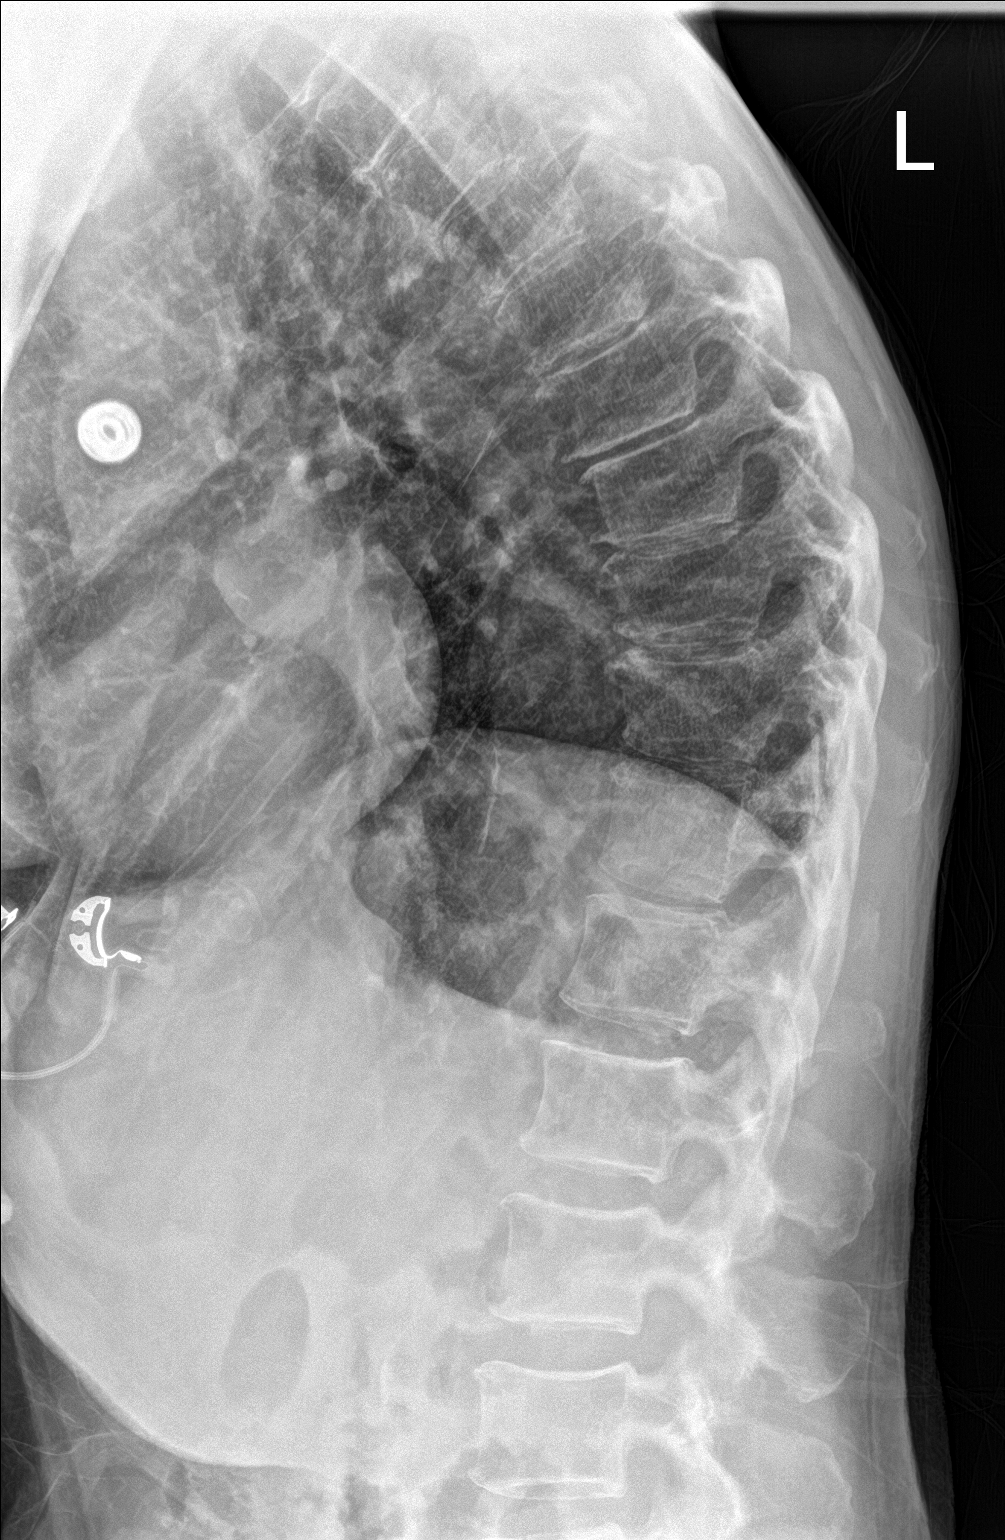

[t-spine swimmers]
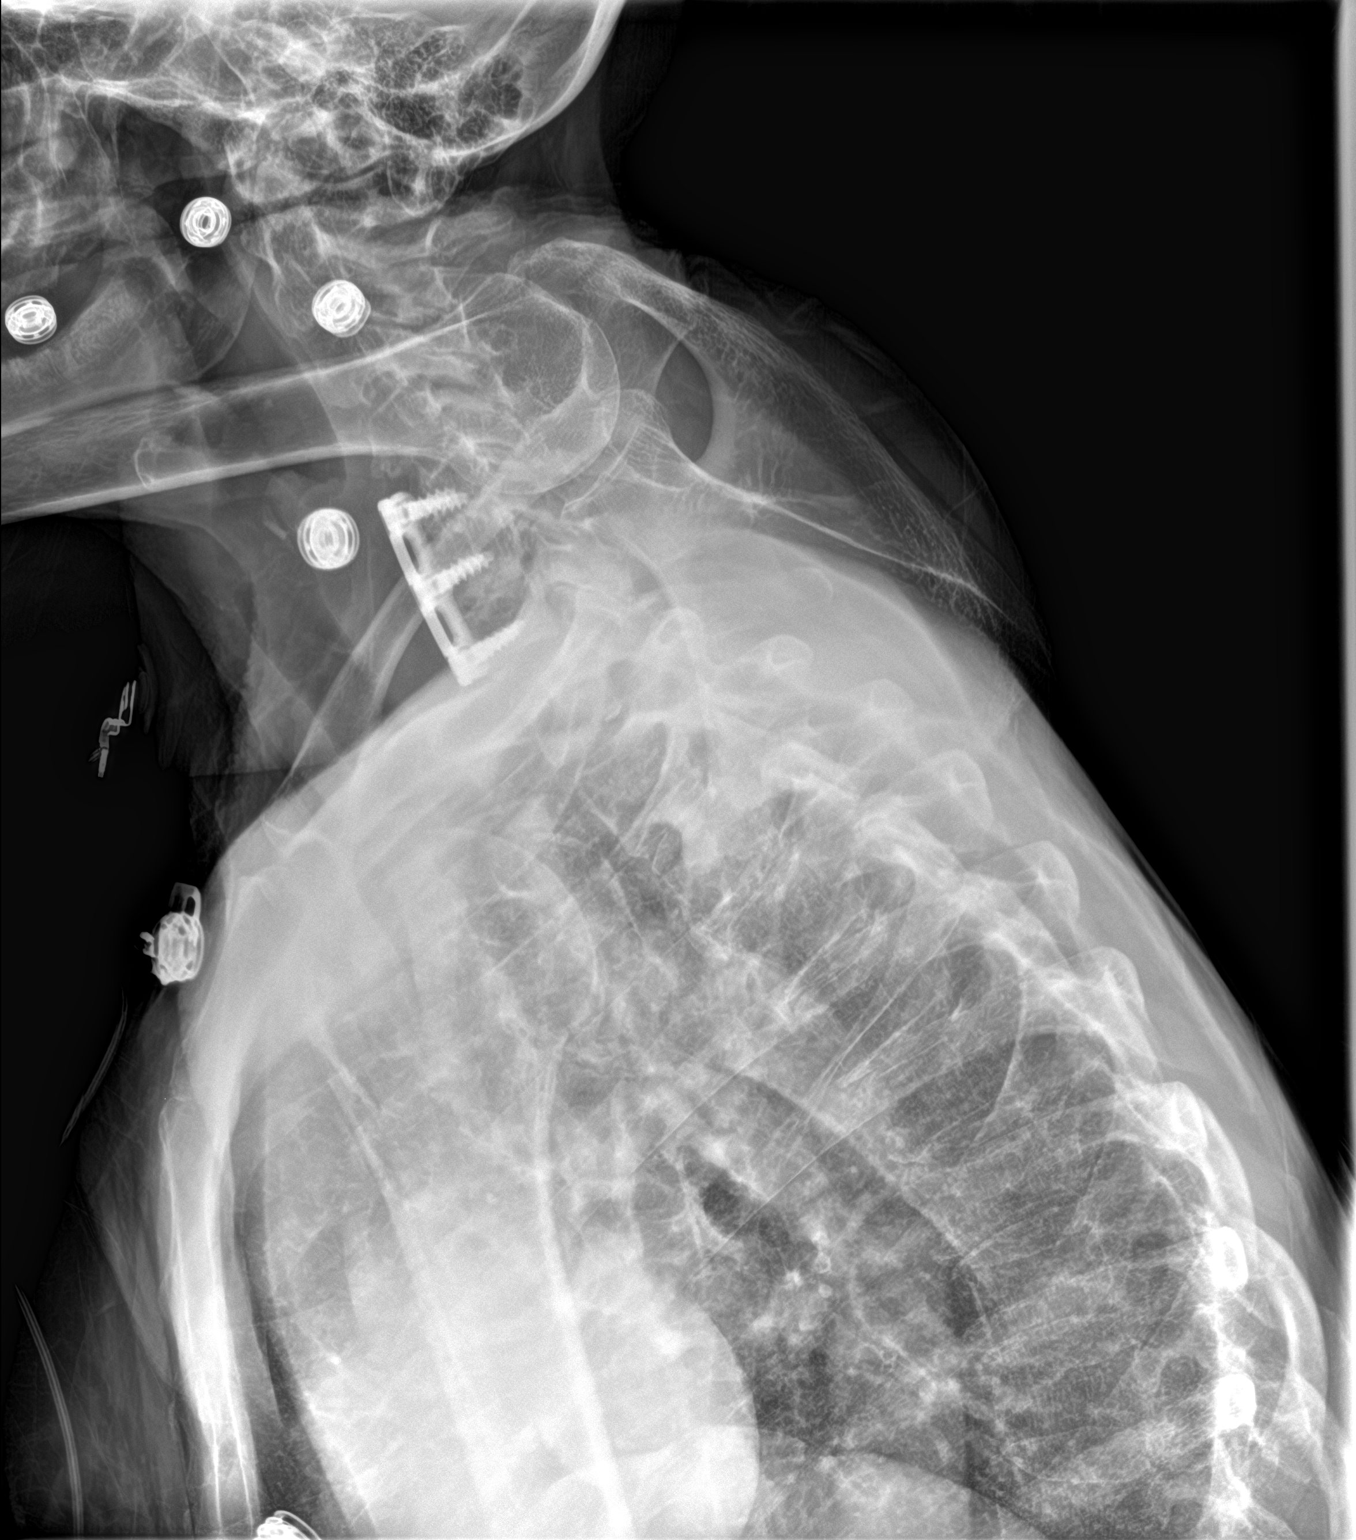

[3 of 3 positions shown; findings below may reference images not displayed]

FINDINGS: Anterior cervical plate fusion at C5-C7. Minimal curvature in the
thoracic spine. The vertebral body heights are maintained. Mild
degenerative endplate changes in the thoracic spine. Normal
alignment at the cervicothoracic junction and thoracolumbar
junction.
IMPRESSION: No acute abnormality.
